# Patient Record
Sex: Female | Born: 1970 | Race: White | Hispanic: No | Marital: Married | Smoking: Former smoker
Health system: Southern US, Community
[De-identification: ages and names within clinical notes are randomized; demographics above are authoritative.]

## PROBLEM LIST (undated history)

## (undated) DIAGNOSIS — E785 Hyperlipidemia, unspecified: Secondary | ICD-10-CM

## (undated) DIAGNOSIS — J45990 Exercise induced bronchospasm: Secondary | ICD-10-CM

## (undated) DIAGNOSIS — F419 Anxiety disorder, unspecified: Secondary | ICD-10-CM

## (undated) DIAGNOSIS — Z9889 Other specified postprocedural states: Secondary | ICD-10-CM

## (undated) DIAGNOSIS — R0609 Other forms of dyspnea: Secondary | ICD-10-CM

## (undated) DIAGNOSIS — N952 Postmenopausal atrophic vaginitis: Secondary | ICD-10-CM

## (undated) DIAGNOSIS — E611 Iron deficiency: Secondary | ICD-10-CM

## (undated) DIAGNOSIS — G43909 Migraine, unspecified, not intractable, without status migrainosus: Secondary | ICD-10-CM

## (undated) DIAGNOSIS — K219 Gastro-esophageal reflux disease without esophagitis: Secondary | ICD-10-CM

## (undated) DIAGNOSIS — E559 Vitamin D deficiency, unspecified: Secondary | ICD-10-CM

## (undated) DIAGNOSIS — J189 Pneumonia, unspecified organism: Secondary | ICD-10-CM

## (undated) DIAGNOSIS — M1990 Unspecified osteoarthritis, unspecified site: Secondary | ICD-10-CM

## (undated) DIAGNOSIS — F32A Depression, unspecified: Secondary | ICD-10-CM

## (undated) DIAGNOSIS — E538 Deficiency of other specified B group vitamins: Secondary | ICD-10-CM

## (undated) HISTORY — DX: Migraine, unspecified, not intractable, without status migrainosus: G43.909

## (undated) HISTORY — DX: Unspecified osteoarthritis, unspecified site: M19.90

## (undated) HISTORY — DX: Depression, unspecified: F32.A

## (undated) HISTORY — DX: Anxiety disorder, unspecified: F41.9

---

## 1988-06-06 DIAGNOSIS — O1490 Unspecified pre-eclampsia, unspecified trimester: Secondary | ICD-10-CM

## 1988-06-06 HISTORY — DX: Unspecified pre-eclampsia, unspecified trimester: O14.90

## 2002-06-06 HISTORY — PX: CHOLECYSTECTOMY: SHX55

## 2004-09-14 ENCOUNTER — Ambulatory Visit: Payer: Self-pay | Admitting: General Surgery

## 2007-03-05 ENCOUNTER — Ambulatory Visit: Payer: Self-pay

## 2008-06-12 ENCOUNTER — Emergency Department: Payer: Self-pay | Admitting: Emergency Medicine
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
## 2009-10-11 IMAGING — CR RIGHT FOOT COMPLETE - 3+ VIEW
1 series · 3 of 3 positions shown · non-contrast
Comparison: none

REASON FOR EXAM: Injury, pain, swelling
COMMENTS:   LMP: 2 weeks

[Series 1: view not recorded · 0.17mm/px · 3 of 3 slices shown]
[im 1/3]
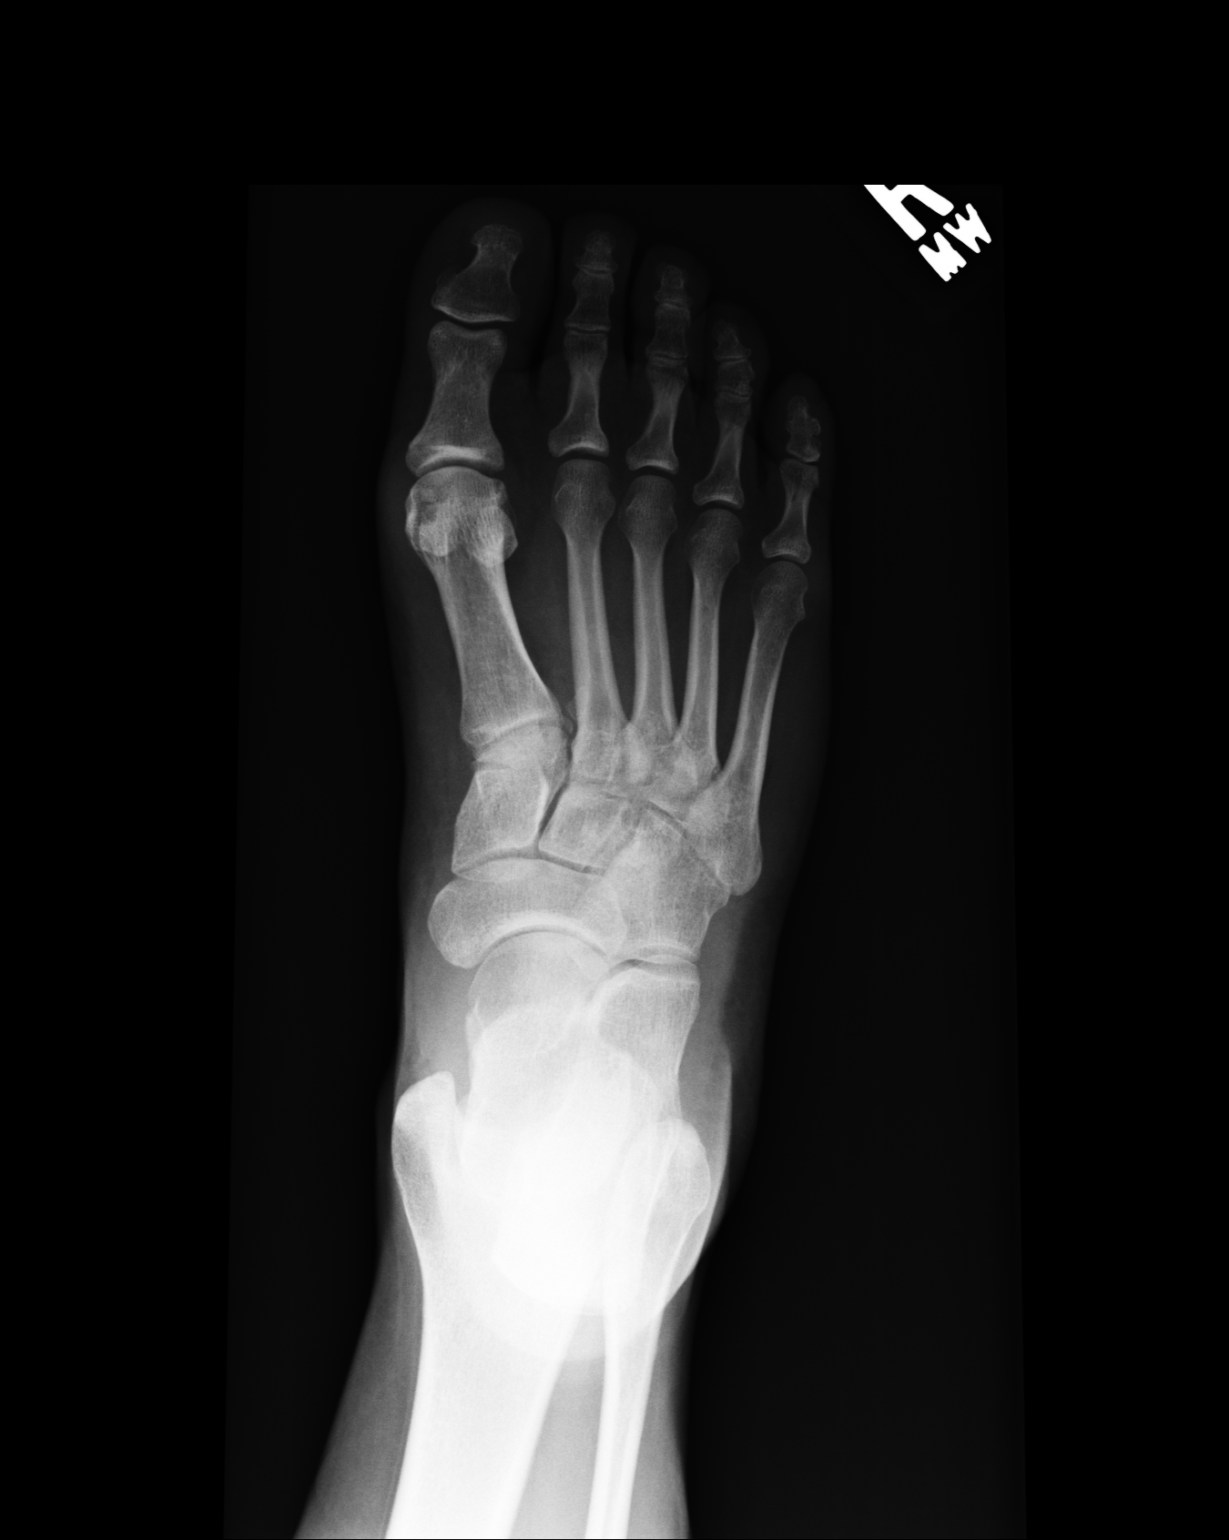
[im 2/3]
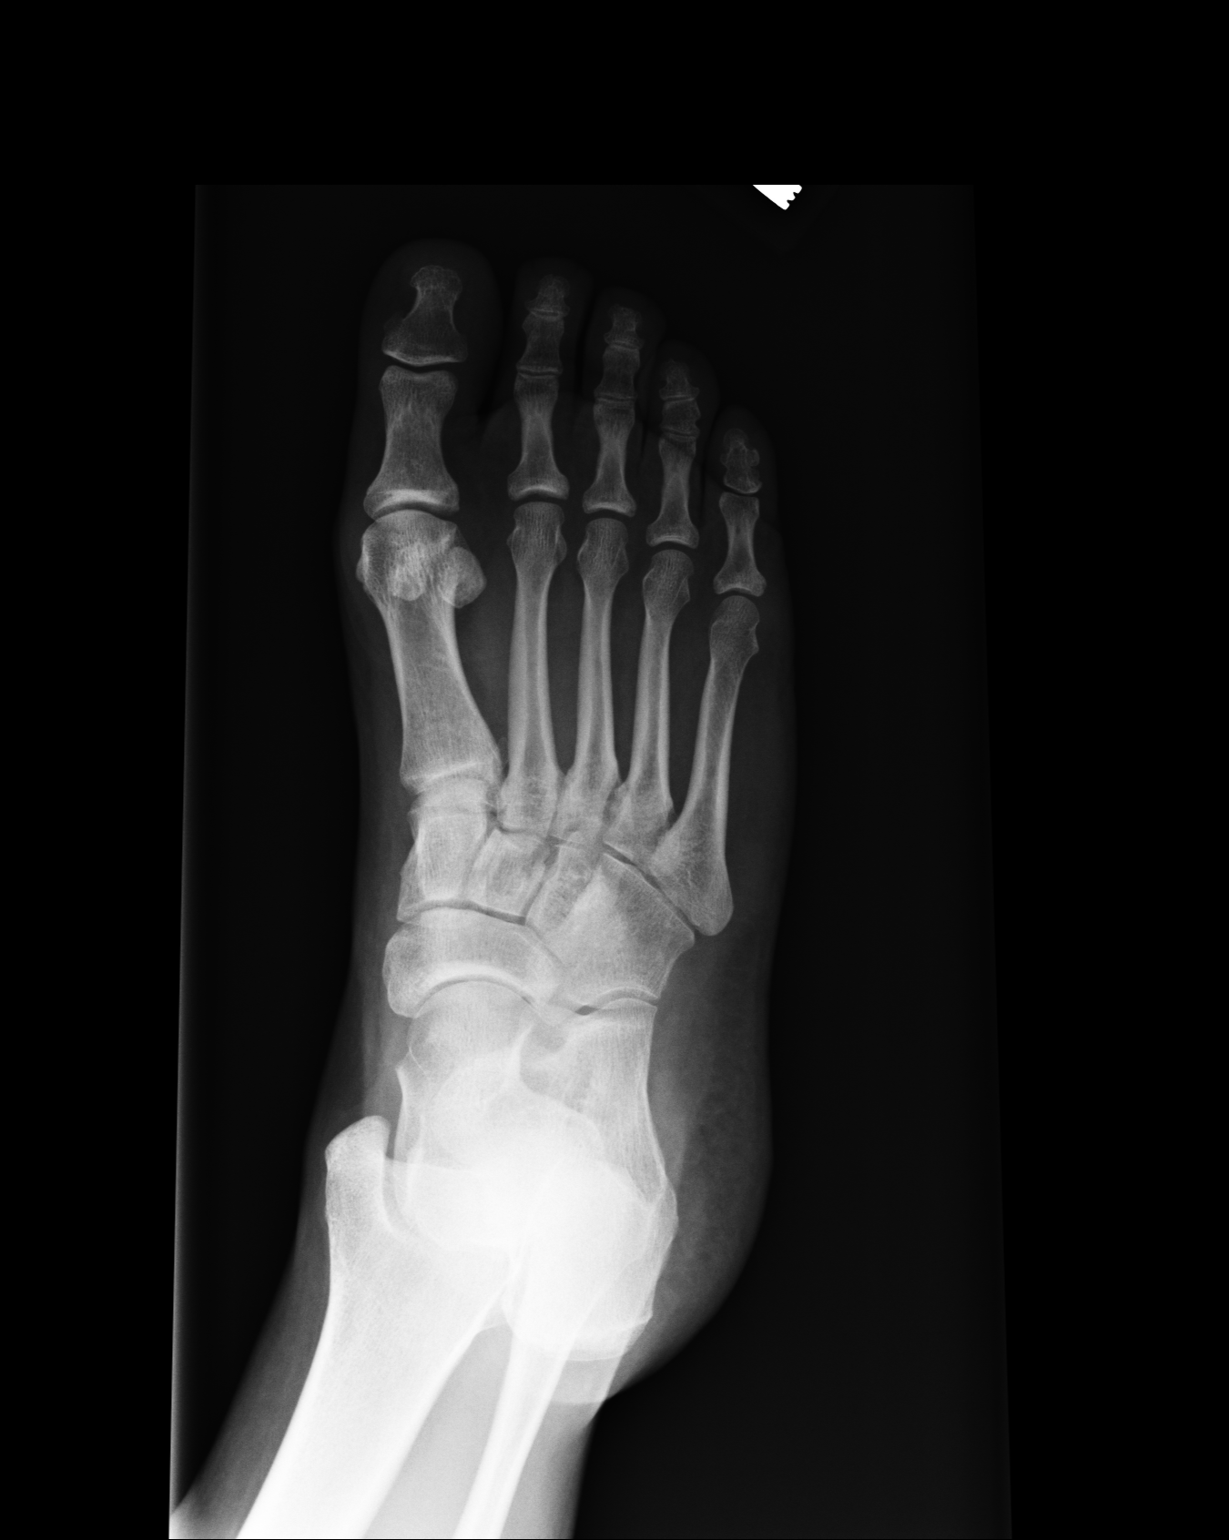
[im 3/3]
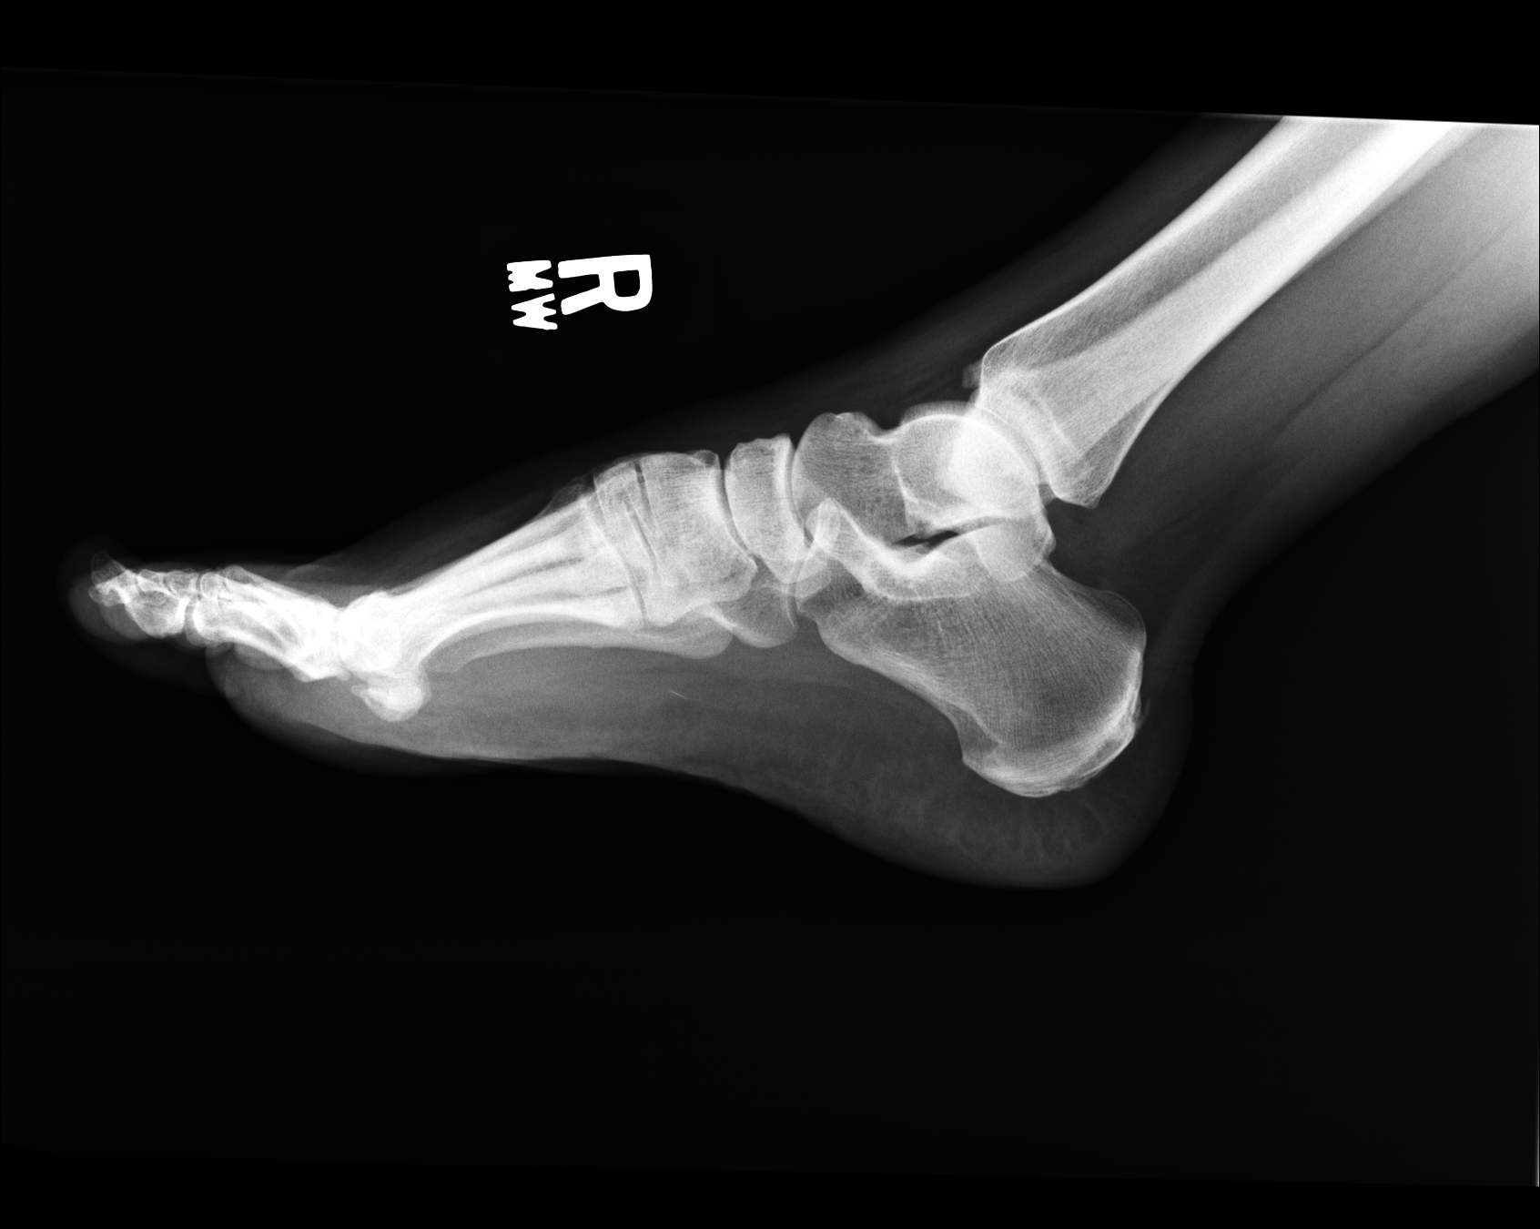

[3 of 3 positions shown; findings below may reference images not displayed]

PROCEDURE:     DXR - DXR FOOT RT COMPLETE W/OBLIQUES  - June 12, 2008  [DATE]

RESULT:     The bones of the foot appear reasonably well mineralized. I do
not see evidence of an acute fracture of the phalanges nor of the
metatarsals. No dislocation is identified. The bones of the hindfoot appear
normal as well. There is a small Achilles region plantar spur. There is a
faint radiodensity projecting inferior to the distal tarsal row on the
lateral film only that may reflect a foreign body. This may be acute or
chronic, however.
IMPRESSION: I do not see evidence of an acute fracture of the bones of
the right foot. Follow-up films coned to any area of persistent symptoms are
recommended. Please see the note above regarding possible linear needle-like
foreign body projecting in the region of the soft tissues of the sole of the
midfoot.

## 2010-03-24 ENCOUNTER — Ambulatory Visit: Payer: Self-pay

## 2010-10-01 IMAGING — CR RIGHT TIBIA AND FIBULA - 2 VIEW
1 series · 2 of 2 positions shown · non-contrast
Comparison: none

REASON FOR EXAM: Injury, pain at proximal tibia, bruising
COMMENTS:   LMP: 2 weeks ago

[Series 1: view not recorded · 0.17mm/px · 2 of 2 slices shown]
[im 1/2]
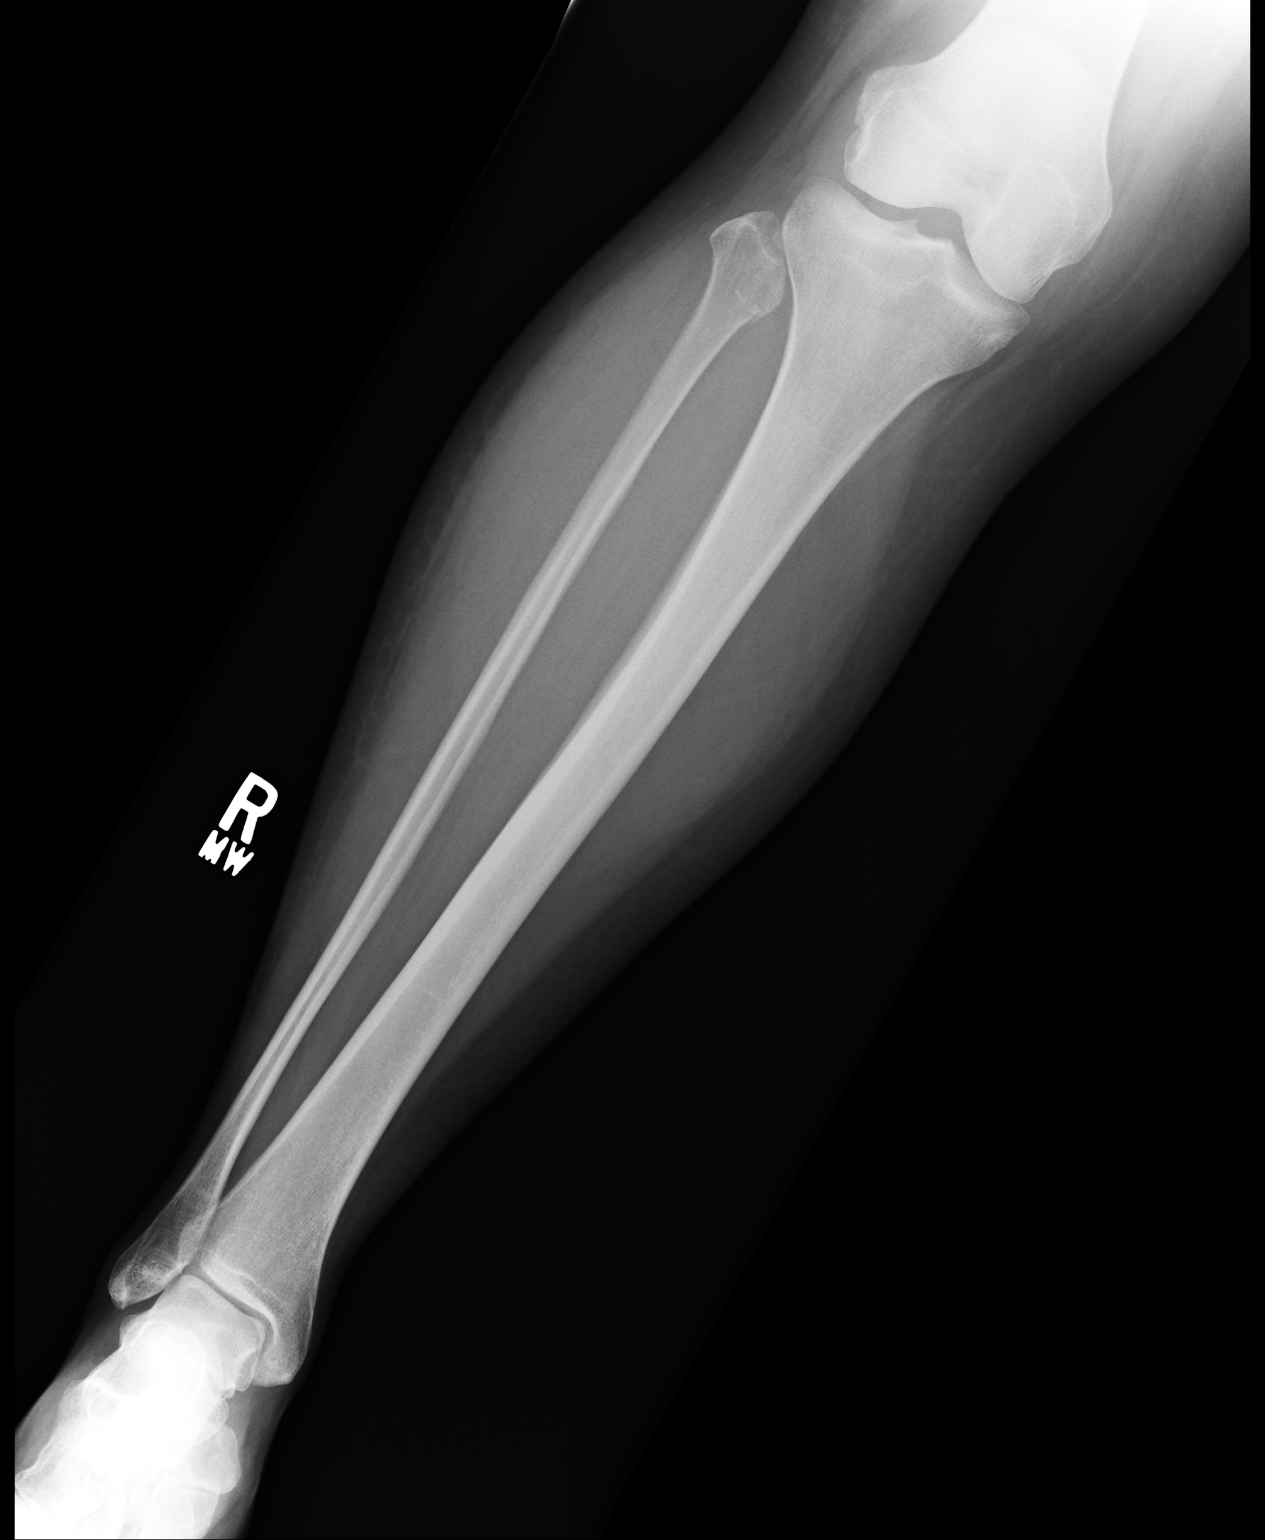
[im 2/2]
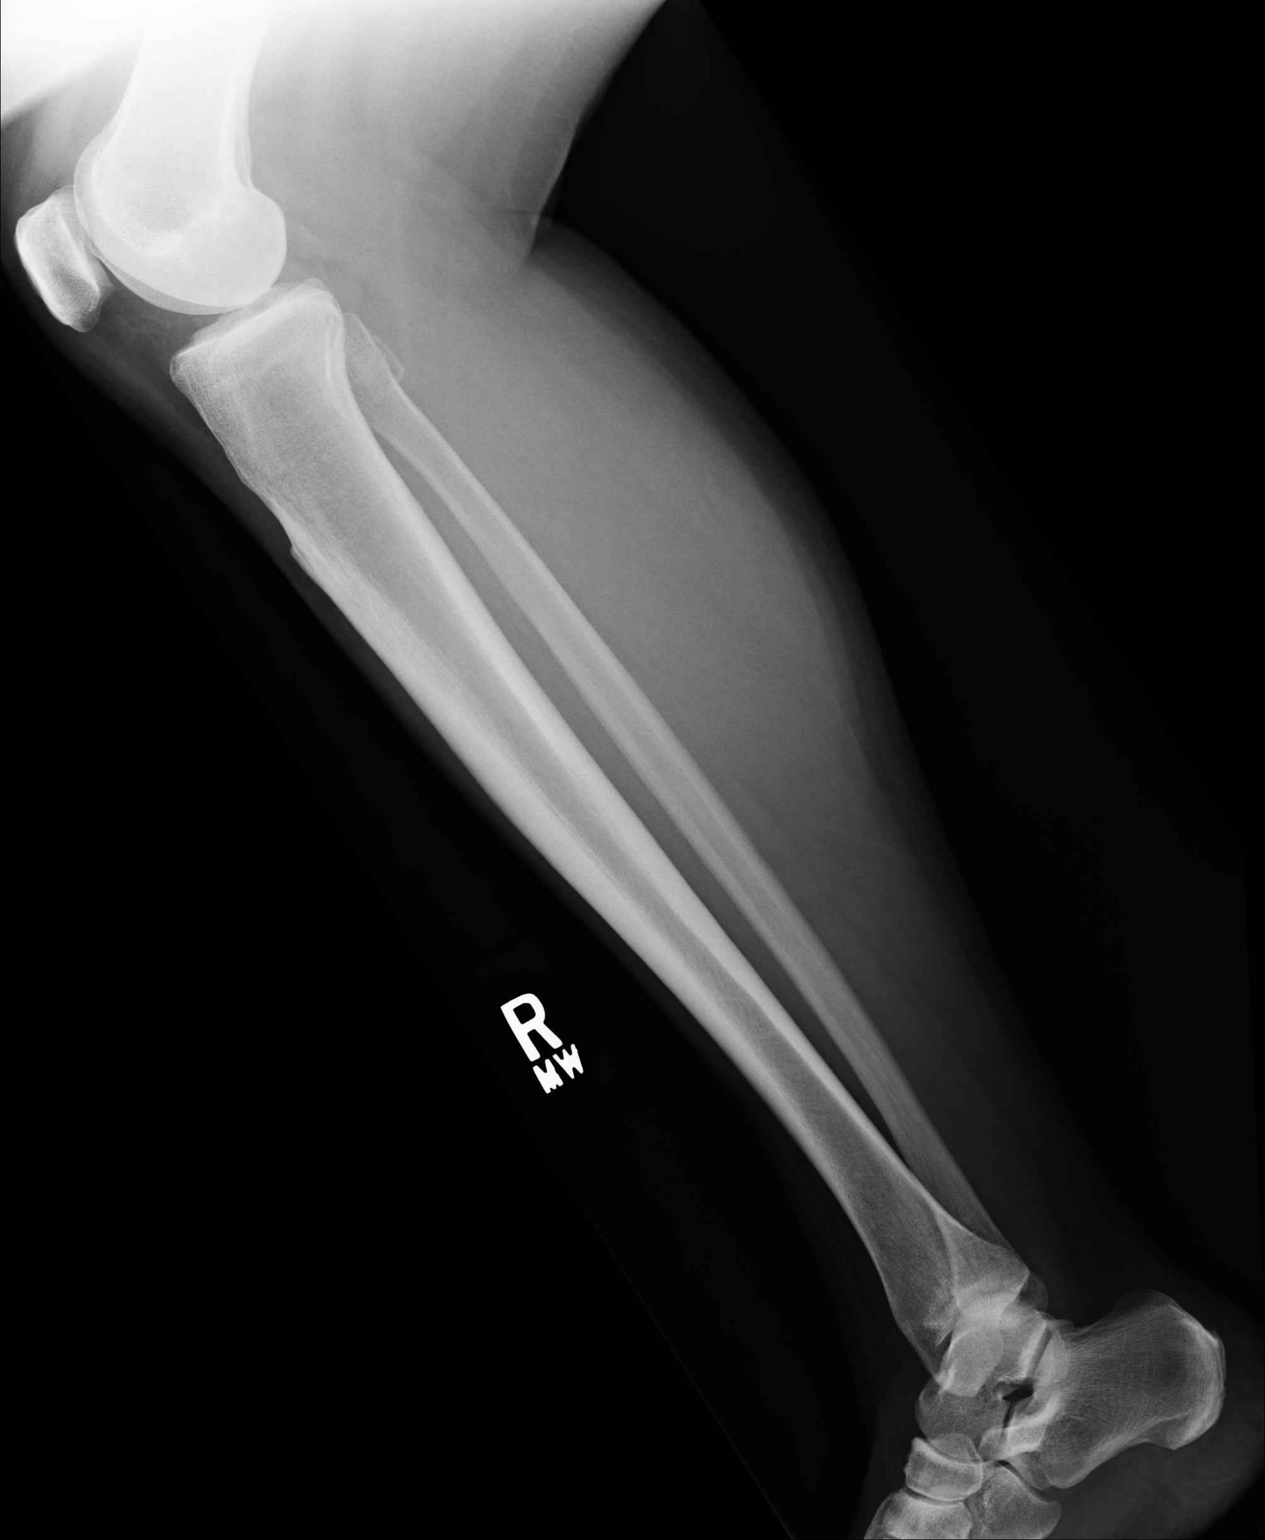

[2 of 2 positions shown; findings below may reference images not displayed]

RESULT:     The patient has sustained a fracture through the proximal
fibula. This fracture is demonstrated best on the lateral film but there is
cortical disruption visible on the frontal film. As best as can be
determined the distal fibula is intact. I see no fracture involving the
diaphysis. The adjacent tibia appears intact.
IMPRESSION: The patient has sustained a nondisplaced fracture of the
proximal fibula. Supplemental imaging of the knee or ankle is available if
the patient has symptoms that warrant this.

## 2012-06-06 DIAGNOSIS — G5601 Carpal tunnel syndrome, right upper limb: Secondary | ICD-10-CM

## 2012-06-06 HISTORY — PX: TRIGGER FINGER RELEASE: SHX641

## 2012-06-06 HISTORY — PX: CARPAL TUNNEL RELEASE: SHX101

## 2012-06-06 HISTORY — DX: Carpal tunnel syndrome, right upper limb: G56.01

## 2014-10-01 DIAGNOSIS — E785 Hyperlipidemia, unspecified: Secondary | ICD-10-CM | POA: Insufficient documentation

## 2014-12-23 DIAGNOSIS — F329 Major depressive disorder, single episode, unspecified: Secondary | ICD-10-CM | POA: Insufficient documentation

## 2014-12-30 DIAGNOSIS — M2022 Hallux rigidus, left foot: Secondary | ICD-10-CM | POA: Insufficient documentation

## 2014-12-30 DIAGNOSIS — M79604 Pain in right leg: Secondary | ICD-10-CM | POA: Insufficient documentation

## 2014-12-30 DIAGNOSIS — M2021 Hallux rigidus, right foot: Secondary | ICD-10-CM | POA: Insufficient documentation

## 2015-07-09 DIAGNOSIS — G5601 Carpal tunnel syndrome, right upper limb: Secondary | ICD-10-CM | POA: Insufficient documentation

## 2015-12-07 DIAGNOSIS — E559 Vitamin D deficiency, unspecified: Secondary | ICD-10-CM | POA: Insufficient documentation

## 2015-12-10 DIAGNOSIS — M65311 Trigger thumb, right thumb: Secondary | ICD-10-CM | POA: Insufficient documentation

## 2018-12-01 DIAGNOSIS — M419 Scoliosis, unspecified: Secondary | ICD-10-CM | POA: Insufficient documentation

## 2018-12-03 DIAGNOSIS — M25511 Pain in right shoulder: Secondary | ICD-10-CM | POA: Insufficient documentation

## 2019-02-13 DIAGNOSIS — R195 Other fecal abnormalities: Secondary | ICD-10-CM | POA: Insufficient documentation

## 2019-04-12 DIAGNOSIS — F419 Anxiety disorder, unspecified: Secondary | ICD-10-CM | POA: Insufficient documentation

## 2020-05-19 DIAGNOSIS — M5430 Sciatica, unspecified side: Secondary | ICD-10-CM | POA: Insufficient documentation

## 2020-05-19 DIAGNOSIS — G8929 Other chronic pain: Secondary | ICD-10-CM | POA: Insufficient documentation

## 2020-06-06 HISTORY — PX: COLONOSCOPY: SHX174

## 2021-05-12 DIAGNOSIS — R0789 Other chest pain: Secondary | ICD-10-CM | POA: Insufficient documentation

## 2021-05-12 DIAGNOSIS — Z6835 Body mass index (BMI) 35.0-35.9, adult: Secondary | ICD-10-CM | POA: Insufficient documentation

## 2021-05-12 DIAGNOSIS — R0609 Other forms of dyspnea: Secondary | ICD-10-CM | POA: Insufficient documentation

## 2021-08-12 DIAGNOSIS — R0789 Other chest pain: Secondary | ICD-10-CM | POA: Diagnosis not present

## 2022-01-03 DIAGNOSIS — H524 Presbyopia: Secondary | ICD-10-CM | POA: Diagnosis not present

## 2022-02-10 ENCOUNTER — Other Ambulatory Visit: Payer: Self-pay | Admitting: Internal Medicine

## 2022-02-10 ENCOUNTER — Ambulatory Visit: Payer: BC Managed Care – PPO | Admitting: Internal Medicine

## 2022-02-10 ENCOUNTER — Encounter: Payer: Self-pay | Admitting: Internal Medicine

## 2022-02-10 VITALS — BP 136/80 | HR 78 | Temp 97.9°F | Resp 16 | Ht 61.5 in | Wt 208.8 lb

## 2022-02-10 DIAGNOSIS — R200 Anesthesia of skin: Secondary | ICD-10-CM | POA: Diagnosis not present

## 2022-02-10 DIAGNOSIS — N952 Postmenopausal atrophic vaginitis: Secondary | ICD-10-CM | POA: Diagnosis not present

## 2022-02-10 DIAGNOSIS — Z6838 Body mass index (BMI) 38.0-38.9, adult: Secondary | ICD-10-CM | POA: Diagnosis not present

## 2022-02-10 DIAGNOSIS — M2560 Stiffness of unspecified joint, not elsewhere classified: Secondary | ICD-10-CM

## 2022-02-10 DIAGNOSIS — G479 Sleep disorder, unspecified: Secondary | ICD-10-CM

## 2022-02-10 DIAGNOSIS — R202 Paresthesia of skin: Secondary | ICD-10-CM

## 2022-02-10 MED ORDER — PREMARIN 0.625 MG/GM VA CREA: TOPICAL_CREAM | VAGINAL | 12 refills | Status: DC

## 2022-02-10 MED ORDER — ESTRADIOL 0.05 MG/24HR TD PTWK: MEDICATED_PATCH | TRANSDERMAL | 2 refills | Status: DC

## 2022-02-10 MED ORDER — TOPIRAMATE 25 MG PO TABS: 25.0000 mg | ORAL_TABLET | Freq: Two times a day (BID) | ORAL | 3 refills | Status: DC

## 2022-02-10 NOTE — Progress Notes (Signed)
[facility] [place], [place] [address]  Internal MEDICINE  Office Visit Note  Patient Name: Natasha Vargas  [id number]  [id number]  Date of Service: 02/10/2022   Complaints/HPI  Chief Complaint  Patient presents with   New Patient (Initial Visit)   Referral    OBGYN  Pt is here for establishment of PCP.  HPI Mom passed away last year, was taking care of her, now wants to get some healthcare Feels weird on left eye and ear, problem with vision, pressure builds up in her head. Eye exam was normal. had flutters, blurry vision at times. H/o migraine headaches. Feels numb at times. Strong [place] of DM Previously has been on Celexa and Buspar but has stopped after her mom passed away She has not had a period in 3 years. Excessive vaginal dryness, hurts to have intercourse.  Has problem maintaining her sleep, tired during the day, snoring as well  Will need mammogram, BMD Will get more information about colonoscopy  C/O arthralgias and stiffness Current Medication: Outpatient Encounter Medications as of 02/10/2022  Medication Sig   conjugated estrogens (PREMARIN) vaginal cream Use 1/3 of the applicator once a week intravaginal   topiramate (TOPAMAX) 25 MG tablet Take 1 tablet (25 mg total) by mouth 2 (two) times daily.   [DISCONTINUED] estradiol (CLIMARA - DOSED IN MG/24 HR) 0.05 mg/24hr patch Use one patch on dry skin 2 a week   No facility-administered encounter medications on file as of 02/10/2022.    Surgical History: Past Surgical History:  Procedure Laterality Date   CHOLECYSTECTOMY  2004    Medical History: Past Medical History:  Diagnosis Date   Anxiety    Arthritis    Depression    Migraines     Family History: Family History  Problem Relation Age of Onset   Hypertension Mother    Heart disease Mother    Diabetes Mother    Glaucoma Mother    Obesity Father    Hypertension Father     Social History   Socioeconomic History    Marital status: Married    Spouse name: Not on file   Number of children: Not on file   Years of education: Not on file   Highest education level: Not on file  Occupational History   Not on file  Tobacco Use   Smoking status: Not on file   Smokeless tobacco: Not on file  Substance and Sexual Activity   Alcohol use: Not on file   Drug use: Never   Sexual activity: Yes  Other Topics Concern   Not on file  Social History Narrative   Not on file   Social Determinants of Health   Financial Resource Strain: Not on file  Food Insecurity: Not on file  Transportation Needs: Not on file  Physical Activity: Not on file  Stress: Not on file  Social Connections: Not on file  Intimate Partner Violence: Not on file     Review of Systems  Constitutional:  Negative for chills, fatigue and unexpected weight change.  HENT:  Negative for congestion, postnasal drip, rhinorrhea, sneezing and sore throat.   Eyes:  Negative for redness.  Respiratory:  Negative for cough, chest tightness and shortness of breath.   Cardiovascular:  Negative for chest pain and palpitations.  Gastrointestinal:  Negative for abdominal pain, constipation, diarrhea, nausea and vomiting.  Genitourinary:  Positive for dyspareunia. Negative for dysuria and frequency.  Musculoskeletal:  Positive for arthralgias and back pain. Negative  for joint swelling and neck pain.  Skin:  Negative for rash.  Neurological: Negative.  Negative for tremors and numbness.  Hematological:  Negative for adenopathy. Does not bruise/bleed easily.  Psychiatric/Behavioral:  Negative for behavioral problems (Depression), sleep disturbance and suicidal ideas. The patient is not nervous/anxious.     Vital Signs: BP 136/80   Pulse 78   Temp 97.9 F (36.6 C)   Resp 16   Ht 5' 1.5" (1.562 m)   Wt 208 lb 12.8 oz (94.7 kg)   SpO2 99%   BMI 38.81 kg/m    Physical Exam Constitutional:      Appearance: Normal appearance.  HENT:     Head:  Normocephalic and atraumatic.     Nose: Nose normal.     Mouth/Throat:     Mouth: Mucous membranes are moist.     Pharynx: No posterior oropharyngeal erythema.  Eyes:     Extraocular Movements: Extraocular movements intact.     Pupils: Pupils are equal, round, and reactive to light.  Cardiovascular:     Pulses: Normal pulses.     Heart sounds: Normal heart sounds.  Pulmonary:     Effort: Pulmonary effort is normal.     Breath sounds: Normal breath sounds.  Neurological:     General: No focal deficit present.     Mental Status: She is alert.  Psychiatric:        Mood and Affect: Mood normal.        Behavior: Behavior normal.       Assessment/Plan: 1. Atrophic vaginitis Patient has severe disease Natasha Vargas will start on low-dose topical hormones estradiol patch along with intravaginal cream for couple of weeks and then will taper down to just the patch if twice a week on the lowest dose. We will also check her fasting lipid profile will need FSH LH and estradiol level as well - conjugated estrogens (PREMARIN) vaginal cream; Use 1/3 of the applicator once a week intravaginal  Dispense: 42.5 g; Refill: 12 - CBC with Differential/Platelet - Lipid Panel With LDL/HDL Ratio - Sed Rate (ESR) - ANA Direct w/Reflex if Positive  2. Numbness and tingling of left side of face Left-sided facial numbness or tingling or pressure feeling patient had an eye exam patient examination which is normal this feeling is there most of the time she feels very uncomfortable and would like to know what is going on patient does give history of migraine headaches in the past, will work on to differential diagnosis. A.  Atypical migraine headache, will treat with low-dose Topamax 25 mg once a day increase to twice a day if improvement seen.   B.  Second possibility is multiple sclerosis patient might need MRI if her symptoms does not resolve  3. Stiffness in joint Complaints of being stiff, lack of exercise  deconditioning however will get sed rate and ANA we will treat accordingly - Sed Rate (ESR) - ANA Direct w/Reflex if Positive  4. Body mass index (BMI) 38.0-38.9, adult Elevated BMI noted we will address on future follow-ups  5. Sleep disturbances Patient has sign and symptoms of OSA ( disturbed sleep, excessive fatigue during the day,  and abnormal BMI). Baseline sleep study is ordered to further look into this. Long term complications of OSA was addressed with the patient.  We will reevaluate   General Counseling: Natasha Vargas verbalizes understanding of the findings of todays visit and agrees with plan of treatment. I have discussed any further diagnostic evaluation that may be needed  or ordered today. We also reviewed her medications today. she has been encouraged to call the office with any questions or concerns that should arise related to todays visit.    Counseling:  Natasha Vargas Controlled Substance Database was reviewed by me.  Orders Placed This Encounter  Procedures   CBC with Differential/Platelet   Lipid Panel With LDL/HDL Ratio   TSH   T4, free   Comprehensive metabolic panel   Sed Rate (ESR)   ANA Direct w/Reflex if Positive   FSH/LH   Estrogens, total    Meds ordered this encounter  Medications   DISCONTD: estradiol (CLIMARA - DOSED IN MG/24 HR) 0.05 mg/24hr patch    Sig: Use one patch on dry skin 2 a week    Dispense:  24 patch    Refill:  2   conjugated estrogens (PREMARIN) vaginal cream    Sig: Use 1/3 of the applicator once a week intravaginal    Dispense:  42.5 g    Refill:  12   topiramate (TOPAMAX) 25 MG tablet    Sig: Take 1 tablet (25 mg total) by mouth 2 (two) times daily.    Dispense:  60 tablet    Refill:  3    Time spent:45 Minutes

## 2022-02-10 NOTE — Telephone Encounter (Signed)
I have put her on both estrogen for few weeks. Please refill

## 2022-02-11 ENCOUNTER — Other Ambulatory Visit: Payer: Self-pay

## 2022-02-11 MED ORDER — ESTRADIOL 0.05 MG/24HR TD PTTW: 1.0000 | MEDICATED_PATCH | TRANSDERMAL | 2 refills | Status: DC

## 2022-02-14 DIAGNOSIS — R202 Paresthesia of skin: Secondary | ICD-10-CM | POA: Diagnosis not present

## 2022-02-14 DIAGNOSIS — R200 Anesthesia of skin: Secondary | ICD-10-CM | POA: Diagnosis not present

## 2022-02-14 DIAGNOSIS — N952 Postmenopausal atrophic vaginitis: Secondary | ICD-10-CM | POA: Diagnosis not present

## 2022-02-14 DIAGNOSIS — N951 Menopausal and female climacteric states: Secondary | ICD-10-CM | POA: Diagnosis not present

## 2022-02-15 LAB — CBC WITH DIFFERENTIAL/PLATELET
Basophils Absolute: 0 10*3/uL (ref 0.0–0.2)
Basos: 1 %
EOS (ABSOLUTE): 0.1 10*3/uL (ref 0.0–0.4)
Eos: 2 %
Hematocrit: 41.6 % (ref 34.0–46.6)
Hemoglobin: 13.9 g/dL (ref 11.1–15.9)
Immature Grans (Abs): 0 10*3/uL (ref 0.0–0.1)
Immature Granulocytes: 0 %
Lymphocytes Absolute: 2.2 10*3/uL (ref 0.7–3.1)
Lymphs: 39 %
MCH: 30 pg (ref 26.6–33.0)
MCHC: 33.4 g/dL (ref 31.5–35.7)
MCV: 90 fL (ref 79–97)
Monocytes Absolute: 0.3 10*3/uL (ref 0.1–0.9)
Monocytes: 6 %
Neutrophils Absolute: 3 10*3/uL (ref 1.4–7.0)
Neutrophils: 52 %
Platelets: 321 10*3/uL (ref 150–450)
RBC: 4.63 x10E6/uL (ref 3.77–5.28)
RDW: 13.1 % (ref 11.7–15.4)
WBC: 5.7 10*3/uL (ref 3.4–10.8)

## 2022-02-15 LAB — LIPID PANEL WITH LDL/HDL RATIO
Cholesterol, Total: 239 mg/dL — ABNORMAL HIGH (ref 100–199)
HDL: 46 mg/dL (ref >39)
LDL Chol Calc (NIH): 174 mg/dL — ABNORMAL HIGH (ref 0–99)
LDL/HDL Ratio: 3.8 ratio — ABNORMAL HIGH (ref 0.0–3.2)
Triglycerides: 105 mg/dL (ref 0–149)
VLDL Cholesterol Cal: 19 mg/dL (ref 5–40)

## 2022-02-15 LAB — SEDIMENTATION RATE: Sed Rate: 24 mm/hr (ref 0–40)

## 2022-02-15 LAB — COMPREHENSIVE METABOLIC PANEL
ALT: 18 IU/L (ref 0–32)
AST: 17 IU/L (ref 0–40)
Albumin/Globulin Ratio: 2 (ref 1.2–2.2)
Albumin: 4.5 g/dL (ref 3.8–4.9)
Alkaline Phosphatase: 67 IU/L (ref 44–121)
BUN/Creatinine Ratio: 17 (ref 9–23)
BUN: 14 mg/dL (ref 6–24)
Bilirubin Total: 0.4 mg/dL (ref 0.0–1.2)
CO2: 21 mmol/L (ref 20–29)
Calcium: 9.5 mg/dL (ref 8.7–10.2)
Chloride: 105 mmol/L (ref 96–106)
Creatinine, Ser: 0.81 mg/dL (ref 0.57–1.00)
Globulin, Total: 2.3 g/dL (ref 1.5–4.5)
Glucose: 99 mg/dL (ref 70–99)
Potassium: 4.6 mmol/L (ref 3.5–5.2)
Sodium: 141 mmol/L (ref 134–144)
Total Protein: 6.8 g/dL (ref 6.0–8.5)
eGFR: 87 mL/min/{1.73_m2} (ref >59)

## 2022-02-15 LAB — TSH: TSH: 1.37 u[IU]/mL (ref 0.450–4.500)

## 2022-02-15 LAB — T4, FREE: Free T4: 1.91 ng/dL — ABNORMAL HIGH (ref 0.82–1.77)

## 2022-02-15 LAB — ANA W/REFLEX IF POSITIVE: Anti Nuclear Antibody (ANA): NEGATIVE

## 2022-02-18 LAB — ESTROGENS, TOTAL: Estrogen: 119 pg/mL

## 2022-02-18 LAB — FSH/LH
FSH: 141 m[IU]/mL
LH: 51 m[IU]/mL

## 2022-02-24 NOTE — Progress Notes (Signed)
Labs will be discussed on next follow up

## 2022-03-01 ENCOUNTER — Ambulatory Visit: Payer: BC Managed Care – PPO | Admitting: Internal Medicine

## 2022-03-01 ENCOUNTER — Encounter: Payer: Self-pay | Admitting: Internal Medicine

## 2022-03-01 VITALS — BP 139/86 | HR 88 | Temp 98.1°F | Resp 16 | Ht 61.5 in | Wt 208.2 lb

## 2022-03-01 DIAGNOSIS — E782 Mixed hyperlipidemia: Secondary | ICD-10-CM | POA: Diagnosis not present

## 2022-03-01 DIAGNOSIS — Z1231 Encounter for screening mammogram for malignant neoplasm of breast: Secondary | ICD-10-CM

## 2022-03-01 DIAGNOSIS — R7989 Other specified abnormal findings of blood chemistry: Secondary | ICD-10-CM

## 2022-03-01 DIAGNOSIS — R202 Paresthesia of skin: Secondary | ICD-10-CM

## 2022-03-01 DIAGNOSIS — R200 Anesthesia of skin: Secondary | ICD-10-CM | POA: Diagnosis not present

## 2022-03-01 DIAGNOSIS — N952 Postmenopausal atrophic vaginitis: Secondary | ICD-10-CM

## 2022-03-01 NOTE — Progress Notes (Signed)
[facility] [address] [place], [place] [address]  Internal MEDICINE  Office Visit Note  Patient Name: Natasha Vargas  [id number]  [id number]  Date of Service: 03/02/2022  Chief Complaint  Patient presents with   Follow-up   Depression   Quality Metric Gaps    Shingles Vaccine    HPI  Pt was started on Topamax for possible atypical migraine headaches. Improving, Felt  weird on left eye and ear, problem with vision, pressure builds up in her head. Eye exam was normal. had flutters, blurry vision at times. H/o migraine headaches. Feels numb at times. Strong FMH of DM Previously has been on Celexa and Buspar but has stopped after her mom passed away She has not had a period in 3 years. Excessive vaginal dryness, hurts to have intercourse. Pt was started on HRT , mild improvement seen  Has problem maintaining her sleep, tired during the day, snoring as well  C/O arthralgias  Labs showed abnormal lipid profile and elevated Free T4   Current Medication: Outpatient Encounter Medications as of 03/01/2022  Medication Sig   conjugated estrogens (PREMARIN) vaginal cream Use 1/3 of the applicator once a week intravaginal   estradiol (VIVELLE-DOT) 0.05 MG/24HR patch Place 1 patch (0.05 mg total) onto the skin 2 (two) times a week.   topiramate (TOPAMAX) 25 MG tablet Take 1 tablet (25 mg total) by mouth 2 (two) times daily.   No facility-administered encounter medications on file as of 03/01/2022.    Surgical History: Past Surgical History:  Procedure Laterality Date   CHOLECYSTECTOMY  2004    Medical History: Past Medical History:  Diagnosis Date   Anxiety    Arthritis    Depression    Migraines     Family History: Family History  Problem Relation Age of Onset   Hypertension Mother    Heart disease Mother    Diabetes Mother    Glaucoma Mother    Obesity Father    Hypertension Father     Social History   Socioeconomic History   Marital status:  Married    Spouse name: Not on file   Number of children: Not on file   Years of education: Not on file   Highest education level: Not on file  Occupational History   Not on file  Tobacco Use   Smoking status: Never    Passive exposure: Never   Smokeless tobacco: Never  Substance and Sexual Activity   Alcohol use: Not on file   Drug use: Never   Sexual activity: Yes  Other Topics Concern   Not on file  Social History Narrative   Not on file   Social Determinants of Health   Financial Resource Strain: Not on file  Food Insecurity: Not on file  Transportation Needs: Not on file  Physical Activity: Not on file  Stress: Not on file  Social Connections: Not on file  Intimate Partner Violence: Not on file      Review of Systems  Constitutional:  Negative for chills, fatigue and unexpected weight change.  HENT:  Negative for congestion, postnasal drip, rhinorrhea, sneezing and sore throat.   Eyes:  Negative for redness.  Respiratory:  Negative for cough, chest tightness and shortness of breath.   Cardiovascular:  Negative for chest pain and palpitations.  Gastrointestinal:  Negative for abdominal pain, constipation, diarrhea, nausea and vomiting.  Genitourinary:  Negative for dysuria and frequency.  Musculoskeletal:  Negative for arthralgias, back pain, joint swelling and neck  pain.  Skin:  Negative for rash.  Neurological: Negative.  Negative for tremors and numbness.  Hematological:  Negative for adenopathy. Does not bruise/bleed easily.  Psychiatric/Behavioral:  Negative for behavioral problems (Depression), sleep disturbance and suicidal ideas. The patient is not nervous/anxious.     Vital Signs: BP 139/86   Pulse 88   Temp 98.1 F (36.7 C)   Resp 16   Ht 5' 1.5" (1.562 m)   Wt 208 lb 3.2 oz (94.4 kg)   SpO2 100%   BMI 38.70 kg/m    Physical Exam Constitutional:      Appearance: Normal appearance.  HENT:     Head: Normocephalic and atraumatic.     Nose:  Nose normal.     Mouth/Throat:     Mouth: Mucous membranes are moist.     Pharynx: No posterior oropharyngeal erythema.  Eyes:     Extraocular Movements: Extraocular movements intact.     Pupils: Pupils are equal, round, and reactive to light.  Cardiovascular:     Pulses: Normal pulses.     Heart sounds: Normal heart sounds.  Pulmonary:     Effort: Pulmonary effort is normal.     Breath sounds: Normal breath sounds.  Neurological:     General: No focal deficit present.     Mental Status: She is alert.  Psychiatric:        Mood and Affect: Mood normal.        Behavior: Behavior normal.        Assessment/Plan: 1. Elevated serum free T4 level Elevated free T4 we will get ultrasound of thyroid - US THYROID; Future  2. Numbness and tingling of left side of face Atypical migraine headache improved on Topamax we will increase Topamax to 25 mg 2 tablets at night  3. Atrophic vaginitis Continue on HRT as before  4. Visit for screening mammogram - MM DIGITAL SCREENING BILATERAL; Future  5. Mixed hyperlipidemia Patient instructed to watch her diet, low-fat low-cholesterol information is given about DASH diet  General Counseling: Natasha Vargas verbalizes understanding of the findings of todays visit and agrees with plan of treatment. I have discussed any further diagnostic evaluation that may be needed or ordered today. We also reviewed her medications today. she has been encouraged to call the office with any questions or concerns that should arise related to todays visit.    Orders Placed This Encounter  Procedures   US THYROID   MM 3D SCREEN BREAST BILATERAL    No orders of the defined types were placed in this encounter.   Total time spent:35 Minutes Time spent includes review of chart, medications, test results, and follow up plan with the patient.   [place] Controlled Substance Database was reviewed by me.   Dr Lavera Guise Internal medicine

## 2022-03-05 ENCOUNTER — Encounter: Payer: Self-pay | Admitting: Internal Medicine

## 2022-03-07 ENCOUNTER — Other Ambulatory Visit: Payer: Self-pay | Admitting: Internal Medicine

## 2022-03-07 DIAGNOSIS — R200 Anesthesia of skin: Secondary | ICD-10-CM

## 2022-03-07 DIAGNOSIS — R7989 Other specified abnormal findings of blood chemistry: Secondary | ICD-10-CM

## 2022-03-07 DIAGNOSIS — G479 Sleep disorder, unspecified: Secondary | ICD-10-CM

## 2022-03-14 ENCOUNTER — Ambulatory Visit: Payer: BC Managed Care – PPO

## 2022-03-14 DIAGNOSIS — G479 Sleep disorder, unspecified: Secondary | ICD-10-CM | POA: Diagnosis not present

## 2022-03-14 DIAGNOSIS — R7989 Other specified abnormal findings of blood chemistry: Secondary | ICD-10-CM

## 2022-03-14 DIAGNOSIS — R202 Paresthesia of skin: Secondary | ICD-10-CM | POA: Diagnosis not present

## 2022-03-14 DIAGNOSIS — R200 Anesthesia of skin: Secondary | ICD-10-CM | POA: Diagnosis not present

## 2022-03-15 ENCOUNTER — Telehealth: Payer: Self-pay

## 2022-03-15 LAB — VITAMIN D 25 HYDROXY (VIT D DEFICIENCY, FRACTURES): Vit D, 25-Hydroxy: 30.6 ng/mL (ref 30.0–100.0)

## 2022-03-15 LAB — IRON,TIBC AND FERRITIN PANEL
Ferritin: 100 ng/mL (ref 15–150)
Iron Saturation: 22 % (ref 15–55)
Iron: 63 ug/dL (ref 27–159)
Total Iron Binding Capacity: 292 ug/dL (ref 250–450)
UIBC: 229 ug/dL (ref 131–425)

## 2022-03-15 LAB — B12 AND FOLATE PANEL
Folate: 13.9 ng/mL (ref >3.0)
Vitamin B-12: 202 pg/mL — ABNORMAL LOW (ref 232–1245)

## 2022-03-15 NOTE — Telephone Encounter (Signed)
-----   Message from Lavera Guise, MD sent at 03/15/2022  9:08 AM EDT ----- Pt needs to get B12 1000 mcg once a week x 3 weeks and then once a month, will discuss on next visit

## 2022-03-15 NOTE — Telephone Encounter (Signed)
Pt advised that B12 is low need b12 injection once a week for 3 weeks and then once a month

## 2022-03-15 NOTE — Progress Notes (Signed)
Pt needs to get B12 1000 mcg once a week x 3 weeks and then once a month, will discuss on next visit

## 2022-03-17 ENCOUNTER — Ambulatory Visit (INDEPENDENT_AMBULATORY_CARE_PROVIDER_SITE_OTHER): Payer: BC Managed Care – PPO

## 2022-03-17 DIAGNOSIS — E538 Deficiency of other specified B group vitamins: Secondary | ICD-10-CM | POA: Diagnosis not present

## 2022-03-17 MED ORDER — CYANOCOBALAMIN 1000 MCG/ML IJ SOLN
1000.0000 ug | Freq: Once | INTRAMUSCULAR | Status: AC
  Administered 2022-03-17: 1000 ug via INTRAMUSCULAR

## 2022-03-18 ENCOUNTER — Ambulatory Visit: Payer: BC Managed Care – PPO

## 2022-03-24 ENCOUNTER — Ambulatory Visit (INDEPENDENT_AMBULATORY_CARE_PROVIDER_SITE_OTHER): Payer: BC Managed Care – PPO

## 2022-03-24 DIAGNOSIS — E538 Deficiency of other specified B group vitamins: Secondary | ICD-10-CM

## 2022-03-24 MED ORDER — CYANOCOBALAMIN 1000 MCG/ML IJ SOLN
1000.0000 ug | Freq: Once | INTRAMUSCULAR | Status: AC
  Administered 2022-03-24: 1000 ug via INTRAMUSCULAR

## 2022-03-25 ENCOUNTER — Ambulatory Visit: Payer: BC Managed Care – PPO

## 2022-03-30 ENCOUNTER — Ambulatory Visit (INDEPENDENT_AMBULATORY_CARE_PROVIDER_SITE_OTHER): Payer: BC Managed Care – PPO

## 2022-03-30 DIAGNOSIS — E538 Deficiency of other specified B group vitamins: Secondary | ICD-10-CM

## 2022-03-30 MED ORDER — CYANOCOBALAMIN 1000 MCG/ML IJ SOLN
1000.0000 ug | Freq: Once | INTRAMUSCULAR | Status: AC
  Administered 2022-03-30: 1000 ug via INTRAMUSCULAR

## 2022-03-31 ENCOUNTER — Ambulatory Visit: Payer: BC Managed Care – PPO

## 2022-03-31 ENCOUNTER — Encounter: Payer: Self-pay | Admitting: Internal Medicine

## 2022-04-19 ENCOUNTER — Other Ambulatory Visit: Payer: BC Managed Care – PPO | Admitting: Internal Medicine

## 2022-04-27 ENCOUNTER — Ambulatory Visit: Payer: BC Managed Care – PPO

## 2022-05-03 ENCOUNTER — Ambulatory Visit (INDEPENDENT_AMBULATORY_CARE_PROVIDER_SITE_OTHER): Payer: BC Managed Care – PPO | Admitting: Internal Medicine

## 2022-05-03 ENCOUNTER — Encounter: Payer: Self-pay | Admitting: Internal Medicine

## 2022-05-03 ENCOUNTER — Telehealth: Payer: Self-pay | Admitting: Internal Medicine

## 2022-05-03 VITALS — BP 122/67 | HR 81 | Temp 98.2°F | Resp 16 | Ht 61.5 in | Wt 206.2 lb

## 2022-05-03 DIAGNOSIS — G43109 Migraine with aura, not intractable, without status migrainosus: Secondary | ICD-10-CM

## 2022-05-03 DIAGNOSIS — R300 Dysuria: Secondary | ICD-10-CM | POA: Diagnosis not present

## 2022-05-03 DIAGNOSIS — Z0001 Encounter for general adult medical examination with abnormal findings: Secondary | ICD-10-CM

## 2022-05-03 DIAGNOSIS — E538 Deficiency of other specified B group vitamins: Secondary | ICD-10-CM | POA: Diagnosis not present

## 2022-05-03 DIAGNOSIS — Z6838 Body mass index (BMI) 38.0-38.9, adult: Secondary | ICD-10-CM

## 2022-05-03 DIAGNOSIS — E782 Mixed hyperlipidemia: Secondary | ICD-10-CM | POA: Diagnosis not present

## 2022-05-03 DIAGNOSIS — E2839 Other primary ovarian failure: Secondary | ICD-10-CM

## 2022-05-03 DIAGNOSIS — R7989 Other specified abnormal findings of blood chemistry: Secondary | ICD-10-CM

## 2022-05-03 DIAGNOSIS — J4520 Mild intermittent asthma, uncomplicated: Secondary | ICD-10-CM

## 2022-05-03 MED ORDER — COMBIVENT RESPIMAT 20-100 MCG/ACT IN AERS: INHALATION_SPRAY | RESPIRATORY_TRACT | 3 refills | Status: DC

## 2022-05-03 MED ORDER — TOPIRAMATE 25 MG PO TABS: ORAL_TABLET | ORAL | 1 refills | Status: DC

## 2022-05-03 MED ORDER — CYANOCOBALAMIN 1000 MCG/ML IJ SOLN
1000.0000 ug | Freq: Once | INTRAMUSCULAR | Status: AC
  Administered 2022-05-03: 1000 ug via INTRAMUSCULAR

## 2022-05-03 NOTE — Telephone Encounter (Signed)
Faxed MR requests to: Dr. Georgian Co; [phone] [facility]; [phone] [facility]; [phone] [facility]; [phone]

## 2022-05-03 NOTE — Progress Notes (Signed)
[facility] [place], [place] [address]  Internal MEDICINE  Office Visit Note  Patient Name: Natasha Vargas  [id number]  [id number]  Date of Service: 05/03/2022  Chief Complaint  Patient presents with  . Annual Exam    Review U/S  . Medication Problem    Premarin cream is not working, patient does not want to do Pap due to this. Patient would like to speak with provider in order to decide if a Pap should be completed today or not.  . Quality Metric Gaps    Shingles Vaccine     HPI Pt is here for routine health maintenance examination Continues to complain about vaginal dryness and painful intercourse. She has been using OTC lubricant, recently was started on HRT, she is on antiplatelet therapy briefly including will be well patch and Premarin vaginal cream will be taper off in few months Patient will schedule bone density as well Continues to be on B12 injections Her left sided numbness on her face is still improved on Topamax so she still has some breakthrough numbness and pain Has history of reactive airway disease has been sick for last few weeks feels better however has been coughing like to have a refill on her Combivent inhaler She has still elevated free T4 and ultrasound of the thyroid was also ordered  Current Medication: Outpatient Encounter Medications as of 05/03/2022  Medication Sig  . conjugated estrogens (PREMARIN) vaginal cream Use 1/3 of the applicator once a week intravaginal  . estradiol (VIVELLE-DOT) 0.05 MG/24HR patch Place 1 patch (0.05 mg total) onto the skin 2 (two) times a week.  . topiramate (TOPAMAX) 25 MG tablet Take 1 tablet (25 mg total) by mouth 2 (two) times daily.  . [EXPIRED] cyanocobalamin (VITAMIN B12) injection 1,000 mcg    No facility-administered encounter medications on file as of 05/03/2022.    Surgical History: Past Surgical History:  Procedure Laterality Date  . CHOLECYSTECTOMY  2004    Medical  History: Past Medical History:  Diagnosis Date  . Anxiety   . Arthritis   . Depression   . Migraines     Family History: Family History  Problem Relation Age of Onset  . Hypertension Mother   . Heart disease Mother   . Diabetes Mother   . Glaucoma Mother   . Obesity Father   . Hypertension Father     Social History: Social History   Socioeconomic History  . Marital status: Married    Spouse name: Not on file  . Number of children: Not on file  . Years of education: Not on file  . Highest education level: Not on file  Occupational History  . Not on file  Tobacco Use  . Smoking status: Never    Passive exposure: Never  . Smokeless tobacco: Never  Substance and Sexual Activity  . Alcohol use: Not on file  . Drug use: Never  . Sexual activity: Yes  Other Topics Concern  . Not on file  Social History Narrative  . Not on file   Social Determinants of Health   Financial Resource Strain: Not on file  Food Insecurity: Not on file  Transportation Needs: Not on file  Physical Activity: Not on file  Stress: Not on file  Social Connections: Not on file      Review of Systems  Constitutional:  Negative for chills, fatigue, fever and unexpected weight change.  HENT:  Negative for congestion, mouth sores, postnasal drip, rhinorrhea, sneezing and  sore throat.   Eyes:  Negative for redness.  Respiratory:  Negative for cough, chest tightness and shortness of breath.   Cardiovascular:  Negative for chest pain and palpitations.  Gastrointestinal:  Negative for abdominal pain, constipation, diarrhea, nausea and vomiting.  Genitourinary:  Positive for dyspareunia and vaginal pain. Negative for dysuria, flank pain and frequency.  Musculoskeletal:  Negative for arthralgias, back pain, joint swelling and neck pain.  Skin:  Negative for rash.  Neurological: Negative.  Negative for tremors and numbness.  Hematological:  Negative for adenopathy. Does not bruise/bleed easily.   Psychiatric/Behavioral: Negative.  Negative for behavioral problems (Depression), sleep disturbance and suicidal ideas. The patient is not nervous/anxious.      Vital Signs: BP 122/67   Pulse 81   Temp 98.2 F (36.8 C)   Resp 16   Ht 5' 1.5" (1.562 m)   Wt 206 lb 3.2 oz (93.5 kg)   SpO2 99%   BMI 38.33 kg/m    Physical Exam Constitutional:      General: She is not in acute distress.    Appearance: Normal appearance. She is well-developed. She is not diaphoretic.  HENT:     Head: Normocephalic and atraumatic.     Right Ear: External ear normal.     Left Ear: External ear normal.     Nose: Nose normal.     Mouth/Throat:     Mouth: Mucous membranes are moist.     Pharynx: No oropharyngeal exudate or posterior oropharyngeal erythema.  Eyes:     General: No scleral icterus.       Right eye: No discharge.        Left eye: No discharge.     Extraocular Movements: Extraocular movements intact.     Conjunctiva/sclera: Conjunctivae normal.     Pupils: Pupils are equal, round, and reactive to light.  Neck:     Thyroid: No thyromegaly.     Vascular: No JVD.     Trachea: No tracheal deviation.  Cardiovascular:     Rate and Rhythm: Normal rate and regular rhythm.     Pulses: Normal pulses.     Heart sounds: Normal heart sounds. No murmur heard.    No friction rub. No gallop.  Pulmonary:     Effort: Pulmonary effort is normal. No respiratory distress.     Breath sounds: Normal breath sounds. No stridor. No wheezing or rales.  Chest:     Chest wall: No tenderness.  Breasts:    Right: Normal.     Left: Normal.  Abdominal:     General: Bowel sounds are normal. There is no distension.     Palpations: Abdomen is soft. There is no mass.     Tenderness: There is no abdominal tenderness. There is no guarding or rebound.  Genitourinary:    General: Normal vulva.     Comments: Pap is not performed due to excessive dryness  Musculoskeletal:        General: No tenderness or  deformity. Normal range of motion.     Cervical back: Normal range of motion and neck supple.  Lymphadenopathy:     Cervical: No cervical adenopathy.     Upper Body:     Right upper body: No axillary adenopathy.     Left upper body: No axillary adenopathy.  Skin:    General: Skin is warm and dry.     Coloration: Skin is not pale.     Findings: No erythema or rash.  Neurological:  General: No focal deficit present.     Mental Status: She is alert.     Cranial Nerves: No cranial nerve deficit.     Motor: No abnormal muscle tone.     Coordination: Coordination normal.     Deep Tendon Reflexes: Reflexes are normal and symmetric.  Psychiatric:        Mood and Affect: Mood normal.        Behavior: Behavior normal.        Thought Content: Thought content normal.        Judgment: Judgment normal.     LABS: Recent Results (from the past 2160 hour(s))  CBC with Differential/Platelet     Status: None   Collection Time: 02/14/22  8:26 AM  Result Value Ref Range   WBC 5.7 3.4 - 10.8 x10E3/uL   RBC 4.63 3.77 - 5.28 x10E6/uL   Hemoglobin 13.9 11.1 - 15.9 g/dL   Hematocrit 41.6 34.0 - 46.6 %   MCV 90 79 - 97 fL   MCH 30.0 26.6 - 33.0 pg   MCHC 33.4 31.5 - 35.7 g/dL   RDW 13.1 11.7 - 15.4 %   Platelets 321 150 - 450 x10E3/uL   Neutrophils 52 Not Estab. %   Lymphs 39 Not Estab. %   Monocytes 6 Not Estab. %   Eos 2 Not Estab. %   Basos 1 Not Estab. %   Neutrophils Absolute 3.0 1.4 - 7.0 x10E3/uL   Lymphocytes Absolute 2.2 0.7 - 3.1 x10E3/uL   Monocytes Absolute 0.3 0.1 - 0.9 x10E3/uL   EOS (ABSOLUTE) 0.1 0.0 - 0.4 x10E3/uL   Basophils Absolute 0.0 0.0 - 0.2 x10E3/uL   Immature Granulocytes 0 Not Estab. %   Immature Grans (Abs) 0.0 0.0 - 0.1 x10E3/uL  Lipid Panel With LDL/HDL Ratio     Status: Abnormal   Collection Time: 02/14/22  8:26 AM  Result Value Ref Range   Cholesterol, Total 239 (H) 100 - 199 mg/dL   Triglycerides 105 0 - 149 mg/dL   HDL 46 >39 mg/dL   VLDL  Cholesterol Cal 19 5 - 40 mg/dL   LDL Chol Calc (NIH) 174 (H) 0 - 99 mg/dL   LDL/HDL Ratio 3.8 (H) 0.0 - 3.2 ratio    Comment:                                     LDL/HDL Ratio                                             Men  Women                               1/2 Avg.Risk  1.0    1.5                                   Avg.Risk  3.6    3.2                                2X Avg.Risk  6.2    5.0  3X Avg.Risk  8.0    6.1   TSH     Status: None   Collection Time: 02/14/22  8:26 AM  Result Value Ref Range   TSH 1.370 0.450 - 4.500 uIU/mL  T4, free     Status: Abnormal   Collection Time: 02/14/22  8:26 AM  Result Value Ref Range   Free T4 1.91 (H) 0.82 - 1.77 ng/dL  Comprehensive metabolic panel     Status: None   Collection Time: 02/14/22  8:26 AM  Result Value Ref Range   Glucose 99 70 - 99 mg/dL   BUN 14 6 - 24 mg/dL   Creatinine, Ser 0.81 0.57 - 1.00 mg/dL   eGFR 87 >59 mL/min/1.73   BUN/Creatinine Ratio 17 9 - 23   Sodium 141 134 - 144 mmol/L   Potassium 4.6 3.5 - 5.2 mmol/L   Chloride 105 96 - 106 mmol/L   CO2 21 20 - 29 mmol/L   Calcium 9.5 8.7 - 10.2 mg/dL   Total Protein 6.8 6.0 - 8.5 g/dL   Albumin 4.5 3.8 - 4.9 g/dL   Globulin, Total 2.3 1.5 - 4.5 g/dL   Albumin/Globulin Ratio 2.0 1.2 - 2.2   Bilirubin Total 0.4 0.0 - 1.2 mg/dL   Alkaline Phosphatase 67 44 - 121 IU/L   AST 17 0 - 40 IU/L   ALT 18 0 - 32 IU/L  Sed Rate (ESR)     Status: None   Collection Time: 02/14/22  8:27 AM  Result Value Ref Range   Sed Rate 24 0 - 40 mm/hr  ANA Direct w/Reflex if Positive     Status: None   Collection Time: 02/14/22  8:27 AM  Result Value Ref Range   Anti Nuclear Antibody (ANA) Negative Negative  FSH/LH     Status: None   Collection Time: 02/14/22  8:34 AM  Result Value Ref Range   LH 51.0 mIU/mL    Comment:                     Adult Female:                       Follicular phase      2.4 -  12.6                       Ovulation phase       14.0 -  95.6                       Luteal phase          1.0 -  11.4                       Postmenopausal        7.7 -  58.5    FSH 141.0 mIU/mL    Comment:                     Adult Female:                       Follicular phase      3.5 -  12.5                       Ovulation phase       4.7 -  21.5  Luteal phase          1.7 -   7.7                       Postmenopausal       25.8 - 134.8   Estrogens, total     Status: None   Collection Time: 02/14/22  8:34 AM  Result Value Ref Range   Estrogen 119 pg/mL    Comment:                          Prepubertal             < 40                          Female Cycle:                            1-10 Days         16 - 328                            11-20 Days        34 - 501                            21-30 Days        48 - 350                            Post-Menopausal   40 - 244   Vitamin D (25 hydroxy)     Status: None   Collection Time: 03/14/22  3:13 PM  Result Value Ref Range   Vit D, 25-Hydroxy 30.6 30.0 - 100.0 ng/mL    Comment: Vitamin D deficiency has been defined by the [place] and an Endocrine Society practice guideline as a level of serum 25-OH vitamin D less than 20 ng/mL (1,2). The Endocrine Society went on to further define vitamin D insufficiency as a level between 21 and 29 ng/mL (2). 1. IOM (Institute of Medicine). 2010. Dietary reference    intakes for calcium and D. [place]: The    [employer]. 2. Holick MF, Binkley [place], Bischoff-Ferrari HA, et al.    Evaluation, treatment, and prevention of vitamin D    deficiency: an Endocrine Society clinical practice    guideline. JCEM. 2011 Jul; 96(7):1911-30.   B12 and Folate Panel     Status: Abnormal   Collection Time: 03/14/22  3:13 PM  Result Value Ref Range   Vitamin B-12 202 (L) 232 - 1,245 pg/mL   Folate 13.9 >3.0 ng/mL    Comment: A serum folate concentration of less than 3.1 ng/mL is considered to represent  clinical deficiency.   Fe+TIBC+Fer     Status: None   Collection Time: 03/14/22  3:13 PM  Result Value Ref Range   Total Iron Binding Capacity 292 250 - 450 ug/dL   UIBC 229 131 - 425 ug/dL   Iron 63 27 - 159 ug/dL   Iron Saturation 22 15 - 55 %   Ferritin 100 15 - 150 ng/mL       Assessment/Plan: 1. Encounter for general adult medical examination with abnormal findings All PHM is updated, mammogram, colonoscopy on next  visit, BMD  2. Other primary ovarian failure Suffering from atrophic vaginitis we will continue on HRT for now get bone density and evaluate for further management might need to see GYN - DG Bone Density; Future  3. Mixed hyperlipidemia Patient interested in lifestyle changes with diet and exercise will monitor for now might need statin if these numbers do not improve  4. Mild intermittent reactive airway disease without complication Has reactive airway disease we will refill her - Ipratropium-Albuterol (COMBIVENT RESPIMAT) 20-100 MCG/ACT AERS respimat; Take 2 puffs 3 x day for asthma  Dispense: 4 g; Refill: 3  5. 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 Elevated serum free T4 level Discussed her thyroid ultrasound there is no significant nodule we will continue to monitor  8. Body mass index (BMI) 38.0-38.9, adult Obesity Counseling: Risk Assessment: An assessment of behavioral risk factors was made today and includes lack of exercise sedentary lifestyle, lack of portion control and poor dietary habits.  Risk Modification Advice: She was counseled on portion control guidelines. Restricting daily caloric intake to 1200KCAL. The detrimental long term effects of  obesity on her health and ongoing poor compliance was also discussed with the patient.    9. Dysuria - UA/M w/rflx Culture, Routine - Microscopic Examination   General Counseling: Bettie verbalizes understanding of the findings of todays visit and agrees with plan of treatment. I have discussed any further diagnostic evaluation that may be needed or ordered today. We also reviewed her medications today. she has been encouraged to call the office with any questions or concerns that should arise related to todays visit.    Counseling:  [place] Controlled Substance Database was reviewed by me.  Orders Placed This Encounter  Procedures  . UA/M w/rflx Culture, Routine    Meds ordered this encounter  Medications  . cyanocobalamin (VITAMIN B12) injection 1,000 mcg    Total time spent:45 Minutes  Time spent includes review of chart, medications, test results, and follow up plan with the patient.     Lavera Guise, MD  Internal Medicine

## 2022-05-04 LAB — UA/M W/RFLX CULTURE, ROUTINE
Bilirubin, UA: NEGATIVE
Glucose, UA: NEGATIVE
Ketones, UA: NEGATIVE
Leukocytes,UA: NEGATIVE
Nitrite, UA: NEGATIVE
Protein,UA: NEGATIVE
RBC, UA: NEGATIVE
Specific Gravity, UA: 1.022 (ref 1.005–1.030)
Urobilinogen, Ur: 0.2 mg/dL (ref 0.2–1.0)
pH, UA: 6.5 (ref 5.0–7.5)

## 2022-05-04 LAB — MICROSCOPIC EXAMINATION
Bacteria, UA: NONE SEEN
Casts: NONE SEEN /lpf
RBC, Urine: NONE SEEN /hpf (ref 0–2)

## 2022-05-04 NOTE — Progress Notes (Signed)
Normal

## 2022-05-26 ENCOUNTER — Other Ambulatory Visit: Payer: Self-pay | Admitting: Internal Medicine

## 2022-05-26 DIAGNOSIS — G43109 Migraine with aura, not intractable, without status migrainosus: Secondary | ICD-10-CM

## 2022-06-01 ENCOUNTER — Ambulatory Visit: Payer: BC Managed Care – PPO

## 2022-06-02 ENCOUNTER — Ambulatory Visit: Payer: BC Managed Care – PPO

## 2022-06-13 ENCOUNTER — Telehealth: Payer: Self-pay | Admitting: Internal Medicine

## 2022-06-13 NOTE — Telephone Encounter (Signed)
Lvm notifying patient that 06/30/22 appointment has been moved to same day with AA @ 2:20-Toni

## 2022-06-28 ENCOUNTER — Ambulatory Visit: Payer: BLUE CROSS/BLUE SHIELD | Admitting: Internal Medicine

## 2022-06-30 ENCOUNTER — Ambulatory Visit: Payer: BLUE CROSS/BLUE SHIELD

## 2022-06-30 ENCOUNTER — Ambulatory Visit: Payer: BLUE CROSS/BLUE SHIELD | Admitting: Nurse Practitioner

## 2022-07-02 ENCOUNTER — Other Ambulatory Visit: Payer: Self-pay | Admitting: Internal Medicine

## 2022-07-12 ENCOUNTER — Other Ambulatory Visit: Payer: Self-pay

## 2022-07-12 ENCOUNTER — Encounter: Payer: Self-pay | Admitting: Internal Medicine

## 2022-07-12 ENCOUNTER — Ambulatory Visit: Payer: BLUE CROSS/BLUE SHIELD | Admitting: Internal Medicine

## 2022-07-12 VITALS — BP 139/71 | HR 81 | Temp 98.3°F | Resp 16 | Ht 61.5 in | Wt 199.2 lb

## 2022-07-12 DIAGNOSIS — Z6837 Body mass index (BMI) 37.0-37.9, adult: Secondary | ICD-10-CM

## 2022-07-12 DIAGNOSIS — G43009 Migraine without aura, not intractable, without status migrainosus: Secondary | ICD-10-CM | POA: Diagnosis not present

## 2022-07-12 DIAGNOSIS — E538 Deficiency of other specified B group vitamins: Secondary | ICD-10-CM | POA: Diagnosis not present

## 2022-07-12 MED ORDER — CYANOCOBALAMIN 1000 MCG/ML IJ SOLN
1000.0000 ug | Freq: Once | INTRAMUSCULAR | Status: AC
  Administered 2022-07-12: 1000 ug via INTRAMUSCULAR

## 2022-07-12 NOTE — Progress Notes (Signed)
[facility] [place], [place] [address]  Internal MEDICINE  Office Visit Note  Patient Name: Natasha Vargas  [id number]  [id number]  Date of Service: 07/12/2022  Chief Complaint  Patient presents with   Follow-up    Pt is wondering if a papsmear may be done in office today or at next physical.   Depression   Anxiety   Quality Metric Gaps    TDAP and Shingles Vaccines    HPI Pt is here for routine for follow up Continues to have headaches, she has history of migraine headaches since a very young age, patient also has burning smell sensation, there is no history of seizure in the past she has never been investigated for petit mall seizure as well Has done well on her weight, making conscious effort to eat healthy and been more active has lost 6 pounds since the last visit HRT is also helping with atrophic vaginitis improving but slow response Continues to be on B12 injection replacement therapy    Current Medication: Outpatient Encounter Medications as of 07/12/2022  Medication Sig   conjugated estrogens (PREMARIN) vaginal cream Use 1/3 of the applicator once a week intravaginal   estradiol (VIVELLE-DOT) 0.05 MG/24HR patch USE ONE PATCH ON DRY SKIN TWICE A WEEK   Ipratropium-Albuterol (COMBIVENT RESPIMAT) 20-100 MCG/ACT AERS respimat Take 2 puffs 3 x day for asthma   topiramate (TOPAMAX) 25 MG tablet Take one tab in am and 2 at bed time   [EXPIRED] cyanocobalamin (VITAMIN B12) injection 1,000 mcg    No facility-administered encounter medications on file as of 07/12/2022.    Surgical History: Past Surgical History:  Procedure Laterality Date   CHOLECYSTECTOMY  2004    Medical History: Past Medical History:  Diagnosis Date   Anxiety    Arthritis    Depression    Migraines     Family History: Family History  Problem Relation Age of Onset   Hypertension Mother    Heart disease Mother    Diabetes Mother    Glaucoma Mother    Obesity Father     Hypertension Father     Social History   Socioeconomic History   Marital status: Married    Spouse name: Not on file   Number of children: Not on file   Years of education: Not on file   Highest education level: Not on file  Occupational History   Not on file  Tobacco Use   Smoking status: Never    Passive exposure: Never   Smokeless tobacco: Never  Substance and Sexual Activity   Alcohol use: Not on file   Drug use: Never   Sexual activity: Yes  Other Topics Concern   Not on file  Social History Narrative   Not on file   Social Determinants of Health   Financial Resource Strain: Not on file  Food Insecurity: Not on file  Transportation Needs: Not on file  Physical Activity: Not on file  Stress: Not on file  Social Connections: Not on file  Intimate Partner Violence: Not on file      Review of Systems  Constitutional:  Negative for chills, fatigue and unexpected weight change.  HENT:  Positive for postnasal drip. Negative for congestion, rhinorrhea, sneezing and sore throat.   Eyes:  Negative for redness.  Respiratory:  Negative for cough, chest tightness and shortness of breath.   Cardiovascular:  Negative for chest pain and palpitations.  Gastrointestinal:  Negative for abdominal pain, constipation, diarrhea,  nausea and vomiting.  Genitourinary:  Negative for dysuria and frequency.  Musculoskeletal:  Negative for arthralgias, back pain, joint swelling and neck pain.  Skin:  Negative for rash.  Neurological: Negative.  Negative for tremors and numbness.  Hematological:  Negative for adenopathy. Does not bruise/bleed easily.  Psychiatric/Behavioral:  Negative for behavioral problems (Depression), sleep disturbance and suicidal ideas. The patient is not nervous/anxious.     Vital Signs: BP 139/71   Pulse 81   Temp 98.3 F (36.8 C)   Resp 16   Ht 5' 1.5" (1.562 m)   Wt 199 lb 3.2 oz (90.4 kg)   LMP  (Approximate)   SpO2 98%   BMI 37.03 kg/m     Physical Exam Constitutional:      Appearance: Normal appearance.  HENT:     Head: Normocephalic and atraumatic.     Nose: Nose normal.     Mouth/Throat:     Mouth: Mucous membranes are moist.     Pharynx: No posterior oropharyngeal erythema.  Eyes:     Extraocular Movements: Extraocular movements intact.     Pupils: Pupils are equal, round, and reactive to light.  Cardiovascular:     Pulses: Normal pulses.     Heart sounds: Normal heart sounds.  Pulmonary:     Effort: Pulmonary effort is normal.     Breath sounds: Normal breath sounds.  Neurological:     General: No focal deficit present.     Mental Status: She is alert.  Psychiatric:        Mood and Affect: Mood normal.        Behavior: Behavior normal.        Assessment/Plan: 1. Atypical migraine Patient has longstanding history of migraine headache with left-sided facial pain sensation of altered smell, recent worsening in these headaches , patient has been started on Topamax 25 mg in the morning and 50 at bedtime however I am a little worried,  patient might be having petit mall seizure and needs to have a EEG, sleep deprived EEG, MRI done and  - Ambulatory referral to Neurology  2. B12 deficiency Continue as before  - cyanocobalamin (VITAMIN B12) injection 1,000 mcg  3. BMI 37.0-37.9, adult Obesity Counseling: Risk Assessment: An assessment of behavioral risk factors was made today and includes lack of exercise sedentary lifestyle, lack of portion control and poor dietary habits.  Risk Modification Advice: She was counseled on portion control guidelines. Restricting daily caloric intake to 1200. The detrimental long term effects of obesity on her health and ongoing poor compliance was also discussed with the patient.      General Counseling: [name] verbalizes understanding of the findings of todays visit and agrees with plan of treatment. I have discussed any further diagnostic evaluation that may be needed  or ordered today. We also reviewed her medications today. she has been encouraged to call the office with any questions or concerns that should arise related to todays visit.    Orders Placed This Encounter  Procedures   Ambulatory referral to Neurology    Meds ordered this encounter  Medications   cyanocobalamin (VITAMIN B12) injection 1,000 mcg    Total time spent:35 Minutes Time spent includes review of chart, medications, test results, and follow up plan with the patient.   [place] Controlled Substance Database was reviewed by me.   Dr Lavera Guise Internal medicine

## 2022-07-14 ENCOUNTER — Telehealth: Payer: Self-pay | Admitting: Internal Medicine

## 2022-07-14 NOTE — Telephone Encounter (Signed)
Neurology referral faxed to [facility] due to insurance ; [phone]

## 2022-07-28 ENCOUNTER — Ambulatory Visit (INDEPENDENT_AMBULATORY_CARE_PROVIDER_SITE_OTHER): Payer: BLUE CROSS/BLUE SHIELD

## 2022-07-28 DIAGNOSIS — E538 Deficiency of other specified B group vitamins: Secondary | ICD-10-CM | POA: Diagnosis not present

## 2022-07-28 MED ORDER — CYANOCOBALAMIN 1000 MCG/ML IJ SOLN
1000.0000 ug | Freq: Once | INTRAMUSCULAR | Status: AC
  Administered 2022-07-28: 1000 ug via INTRAMUSCULAR

## 2022-08-02 ENCOUNTER — Telehealth: Payer: Self-pay | Admitting: Internal Medicine

## 2022-08-02 ENCOUNTER — Encounter: Payer: Self-pay | Admitting: Internal Medicine

## 2022-08-02 NOTE — Telephone Encounter (Signed)
Patient sent message stating [facility] neurology cannot get her in until January of 2025. I explained to her due to her insurance, she has to go to [facility]. Advised her to call her insurance to see if there is another facility she can go to and let me know-Toni

## 2022-08-23 ENCOUNTER — Inpatient Hospital Stay
Admission: RE | Admit: 2022-08-23 | Discharge: 2022-08-23 | Disposition: A | Discharge status: Home / Self Care | Payer: Self-pay | Source: Ambulatory Visit | Attending: *Deleted | Admitting: *Deleted

## 2022-08-23 ENCOUNTER — Other Ambulatory Visit: Payer: Self-pay | Admitting: *Deleted

## 2022-08-23 ENCOUNTER — Telehealth: Payer: Self-pay | Admitting: Internal Medicine

## 2022-08-23 ENCOUNTER — Other Ambulatory Visit: Payer: Self-pay | Admitting: Internal Medicine

## 2022-08-23 ENCOUNTER — Ambulatory Visit
Admission: RE | Admit: 2022-08-23 | Discharge: 2022-08-23 | Disposition: A | Discharge status: Home / Self Care | Payer: BLUE CROSS/BLUE SHIELD | Source: Ambulatory Visit | Attending: Internal Medicine | Admitting: Internal Medicine

## 2022-08-23 ENCOUNTER — Ambulatory Visit: Payer: Self-pay

## 2022-08-23 DIAGNOSIS — Z1231 Encounter for screening mammogram for malignant neoplasm of breast: Secondary | ICD-10-CM

## 2022-08-23 DIAGNOSIS — N6310 Unspecified lump in the right breast, unspecified quadrant: Secondary | ICD-10-CM

## 2022-08-23 NOTE — Telephone Encounter (Signed)
Patient called this morning stating she was @ [facility] for her screening mammogram. When she told them there was an issue with one of her breast, order had to bee changed to diagnostic with ultrasound. Orders completed. When [place] receives previous mammogram from [facility], they will call patient to schedule. I notified patient-Toni

## 2022-08-24 ENCOUNTER — Other Ambulatory Visit: Payer: Self-pay | Admitting: Internal Medicine

## 2022-08-24 DIAGNOSIS — N6310 Unspecified lump in the right breast, unspecified quadrant: Secondary | ICD-10-CM

## 2022-08-30 ENCOUNTER — Ambulatory Visit: Payer: BLUE CROSS/BLUE SHIELD | Admitting: Internal Medicine

## 2022-09-01 ENCOUNTER — Ambulatory Visit
Admission: RE | Admit: 2022-09-01 | Discharge: 2022-09-01 | Disposition: A | Discharge status: Home / Self Care | Payer: BLUE CROSS/BLUE SHIELD | Source: Ambulatory Visit | Attending: Internal Medicine | Admitting: Internal Medicine

## 2022-09-01 ENCOUNTER — Ambulatory Visit (INDEPENDENT_AMBULATORY_CARE_PROVIDER_SITE_OTHER): Payer: BLUE CROSS/BLUE SHIELD

## 2022-09-01 DIAGNOSIS — N6310 Unspecified lump in the right breast, unspecified quadrant: Secondary | ICD-10-CM | POA: Diagnosis not present

## 2022-09-01 DIAGNOSIS — R928 Other abnormal and inconclusive findings on diagnostic imaging of breast: Secondary | ICD-10-CM | POA: Diagnosis not present

## 2022-09-01 DIAGNOSIS — N6489 Other specified disorders of breast: Secondary | ICD-10-CM | POA: Diagnosis not present

## 2022-09-01 DIAGNOSIS — E538 Deficiency of other specified B group vitamins: Secondary | ICD-10-CM | POA: Diagnosis not present

## 2022-09-01 MED ORDER — CYANOCOBALAMIN 1000 MCG/ML IJ SOLN
1000.0000 ug | Freq: Once | INTRAMUSCULAR | Status: AC
  Administered 2022-09-01: 1000 ug via INTRAMUSCULAR

## 2022-09-07 ENCOUNTER — Encounter: Payer: Self-pay | Admitting: Internal Medicine

## 2022-09-07 DIAGNOSIS — G43009 Migraine without aura, not intractable, without status migrainosus: Secondary | ICD-10-CM

## 2022-09-09 ENCOUNTER — Telehealth: Payer: Self-pay | Admitting: Internal Medicine

## 2022-09-09 NOTE — Telephone Encounter (Signed)
Neurology referral faxed to [facility] due to insurance ; [phone]-Toni 

## 2022-09-23 ENCOUNTER — Other Ambulatory Visit: Payer: Self-pay | Admitting: Internal Medicine

## 2022-09-29 ENCOUNTER — Ambulatory Visit: Payer: BLUE CROSS/BLUE SHIELD

## 2022-09-30 ENCOUNTER — Encounter: Payer: Self-pay | Admitting: Internal Medicine

## 2022-09-30 ENCOUNTER — Ambulatory Visit (INDEPENDENT_AMBULATORY_CARE_PROVIDER_SITE_OTHER): Payer: BLUE CROSS/BLUE SHIELD

## 2022-09-30 DIAGNOSIS — E538 Deficiency of other specified B group vitamins: Secondary | ICD-10-CM | POA: Diagnosis not present

## 2022-09-30 MED ORDER — CYANOCOBALAMIN 1000 MCG/ML IJ SOLN
1000.0000 ug | Freq: Once | INTRAMUSCULAR | Status: AC
  Administered 2022-09-30: 1000 ug via INTRAMUSCULAR

## 2022-10-05 ENCOUNTER — Other Ambulatory Visit: Payer: Self-pay

## 2022-10-13 ENCOUNTER — Encounter: Payer: Self-pay | Admitting: Physician Assistant

## 2022-10-13 ENCOUNTER — Ambulatory Visit (INDEPENDENT_AMBULATORY_CARE_PROVIDER_SITE_OTHER): Payer: BLUE CROSS/BLUE SHIELD | Admitting: Physician Assistant

## 2022-10-13 ENCOUNTER — Telehealth: Payer: Self-pay | Admitting: Nurse Practitioner

## 2022-10-13 VITALS — BP 112/70 | HR 80 | Temp 98.2°F | Resp 16 | Ht 61.5 in | Wt 196.6 lb

## 2022-10-13 DIAGNOSIS — G43009 Migraine without aura, not intractable, without status migrainosus: Secondary | ICD-10-CM | POA: Diagnosis not present

## 2022-10-13 DIAGNOSIS — J0100 Acute maxillary sinusitis, unspecified: Secondary | ICD-10-CM | POA: Diagnosis not present

## 2022-10-13 DIAGNOSIS — F411 Generalized anxiety disorder: Secondary | ICD-10-CM

## 2022-10-13 MED ORDER — AMOXICILLIN-POT CLAVULANATE 875-125 MG PO TABS: 1.0000 | ORAL_TABLET | Freq: Two times a day (BID) | ORAL | 0 refills | Status: DC

## 2022-10-13 MED ORDER — CITALOPRAM HYDROBROMIDE 10 MG PO TABS: 10.0000 mg | ORAL_TABLET | Freq: Every day | ORAL | 3 refills | Status: DC

## 2022-10-13 NOTE — Progress Notes (Signed)
[facility] [address] [place], [place] [address]  Internal MEDICINE  Office Visit Note  Patient Name: Natasha Vargas  [id number]  [id number]  Date of Service: 10/13/2022  Chief Complaint  Patient presents with   Follow-up   Depression   Sinusitis    Possible sinus infection, at first patient thought it was allergies, but no improvement after 2 weeks. Patient taking Claritin daily.   Fatigue    Fatigue has been more prevalent over the last month.    HPI Pt is here for routine follow up -Was outside 2 weeks ago while son was mowing and thinks that triggered her allergies, but this has continued and progressed and is now congested, tired, and having colored mucus and drainage, some cough, no ear pain -Using antihistamine, did try mucinex initially but not recently -Can't get into neurology until Jan 2025, open to paying out of pocket to see Dr. Malvin Johns instead if no other in network options prior to Jan. Needs referral sent to new location. -Continues to have migraines, vision changes despite seeing eye doctor and head pressure which was reason for referral -Was taking topamax and then stopped briefly and is back on it and seems to help some -Had mammogram  -Still wearing estradiol patches -has been feeling more anxious and losing temper. Does also feel stressed all the time and is tired. Husband does work second shift so sleep can be disrupted falling asleep on couch. States she used to take citalopram and did well with it and would like to restart  Current Medication: Outpatient Encounter Medications as of 10/13/2022  Medication Sig   amoxicillin-clavulanate (AUGMENTIN) 875-125 MG tablet Take 1 tablet by mouth 2 (two) times daily. Take with food.   citalopram (CELEXA) 10 MG tablet Take 1 tablet (10 mg total) by mouth daily.   conjugated estrogens (PREMARIN) vaginal cream Use 1/3 of the applicator once a week intravaginal   estradiol (VIVELLE-DOT) 0.05 MG/24HR patch  USE ONE PATCH ON DRY SKIN TWICE A WEEK   Ipratropium-Albuterol (COMBIVENT RESPIMAT) 20-100 MCG/ACT AERS respimat Take 2 puffs 3 x day for asthma   topiramate (TOPAMAX) 25 MG tablet Take one tab in am and 2 at bed time   No facility-administered encounter medications on file as of 10/13/2022.    Surgical History: Past Surgical History:  Procedure Laterality Date   CHOLECYSTECTOMY  2004    Medical History: Past Medical History:  Diagnosis Date   Anxiety    Arthritis    Depression    Migraines     Family History: Family History  Problem Relation Age of Onset   Hypertension Mother    Heart disease Mother    Diabetes Mother    Glaucoma Mother    Obesity Father    Hypertension Father     Social History   Socioeconomic History   Marital status: Married    Spouse name: Not on file   Number of children: Not on file   Years of education: Not on file   Highest education level: Not on file  Occupational History   Not on file  Tobacco Use   Smoking status: Never    Passive exposure: Never   Smokeless tobacco: Never  Substance and Sexual Activity   Alcohol use: Not on file   Drug use: Never   Sexual activity: Yes  Other Topics Concern   Not on file  Social History Narrative   Not on file   Social Determinants of Health  Financial Resource Strain: Not on file  Food Insecurity: Not on file  Transportation Needs: Not on file  Physical Activity: Not on file  Stress: Not on file  Social Connections: Not on file  Intimate Partner Violence: Not on file      Review of Systems  Constitutional:  Negative for chills, fatigue and unexpected weight change.  HENT:  Positive for congestion, postnasal drip and sinus pressure. Negative for rhinorrhea, sneezing and sore throat.   Eyes:  Negative for redness.  Respiratory:  Positive for cough. Negative for chest tightness and shortness of breath.   Cardiovascular:  Negative for chest pain and palpitations.  Gastrointestinal:   Negative for abdominal pain, constipation, diarrhea, nausea and vomiting.  Genitourinary:  Negative for dysuria and frequency.  Musculoskeletal:  Negative for arthralgias, back pain, joint swelling and neck pain.  Skin:  Negative for rash.  Neurological: Negative.  Negative for tremors and numbness.  Hematological:  Negative for adenopathy. Does not bruise/bleed easily.  Psychiatric/Behavioral:  Positive for sleep disturbance. Negative for behavioral problems (Depression) and suicidal ideas. The patient is nervous/anxious.     Vital Signs: BP 112/70   Pulse 80   Temp 98.2 F (36.8 C)   Resp 16   Ht 5' 1.5" (1.562 m)   Wt 196 lb 9.6 oz (89.2 kg)   SpO2 98%   BMI 36.55 kg/m    Physical Exam Constitutional:      Appearance: Normal appearance.  HENT:     Head: Normocephalic and atraumatic.     Nose: Nose normal.     Mouth/Throat:     Mouth: Mucous membranes are moist.     Pharynx: No posterior oropharyngeal erythema.  Eyes:     Extraocular Movements: Extraocular movements intact.     Pupils: Pupils are equal, round, and reactive to light.  Cardiovascular:     Rate and Rhythm: Normal rate and regular rhythm.     Pulses: Normal pulses.     Heart sounds: Normal heart sounds.  Pulmonary:     Effort: Pulmonary effort is normal.     Breath sounds: Normal breath sounds.  Neurological:     General: No focal deficit present.     Mental Status: She is alert.  Psychiatric:        Mood and Affect: Mood normal.        Behavior: Behavior normal.        Assessment/Plan: 1. GAD (generalized anxiety disorder) Will start on celexa and titrate as needed - citalopram (CELEXA) 10 MG tablet; Take 1 tablet (10 mg total) by mouth daily.  Dispense: 30 tablet; Refill: 3  2. Acute non-recurrent maxillary sinusitis Will start on augmentin BID with food and restart mucinex - amoxicillin-clavulanate (AUGMENTIN) 875-125 MG tablet; Take 1 tablet by mouth 2 (two) times daily. Take with food.   Dispense: 20 tablet; Refill: 0  3. Atypical migraine Will redirect neurology referral, continue topamax as before   General Counseling: Natasha Vargas verbalizes understanding of the findings of todays visit and agrees with plan of treatment. I have discussed any further diagnostic evaluation that may be needed or ordered today. We also reviewed her medications today. she has been encouraged to call the office with any questions or concerns that should arise related to todays visit.    No orders of the defined types were placed in this encounter.   Meds ordered this encounter  Medications   amoxicillin-clavulanate (AUGMENTIN) 875-125 MG tablet    Sig: Take 1 tablet by mouth  2 (two) times daily. Take with food.    Dispense:  20 tablet    Refill:  0   citalopram (CELEXA) 10 MG tablet    Sig: Take 1 tablet (10 mg total) by mouth daily.    Dispense:  30 tablet    Refill:  3    This patient was seen by Lynn Ito, PA-C in collaboration with Dr. Beverely Risen as a part of collaborative care agreement.   Total time spent:30 Minutes Time spent includes review of chart, medications, test results, and follow up plan with the patient.      Dr Lyndon Code Internal medicine

## 2022-10-13 NOTE — Telephone Encounter (Signed)
Patient stated [facility] cannot get her in until next year. Per patient's request, referral sent via [facility] to Dr. Malvin Johns with [facility]

## 2022-11-01 ENCOUNTER — Encounter: Payer: Self-pay | Admitting: Internal Medicine

## 2022-11-01 ENCOUNTER — Ambulatory Visit (INDEPENDENT_AMBULATORY_CARE_PROVIDER_SITE_OTHER): Payer: BLUE CROSS/BLUE SHIELD | Admitting: Internal Medicine

## 2022-11-01 VITALS — BP 130/85 | HR 74 | Temp 98.4°F | Resp 16 | Ht 61.5 in | Wt 201.2 lb

## 2022-11-01 DIAGNOSIS — N950 Postmenopausal bleeding: Secondary | ICD-10-CM | POA: Diagnosis not present

## 2022-11-01 DIAGNOSIS — Z79890 Hormone replacement therapy (postmenopausal): Secondary | ICD-10-CM | POA: Diagnosis not present

## 2022-11-01 DIAGNOSIS — E538 Deficiency of other specified B group vitamins: Secondary | ICD-10-CM

## 2022-11-01 DIAGNOSIS — N946 Dysmenorrhea, unspecified: Secondary | ICD-10-CM | POA: Diagnosis not present

## 2022-11-01 MED ORDER — PROGESTERONE MICRONIZED 100 MG PO CAPS: ORAL_CAPSULE | ORAL | 1 refills | Status: DC

## 2022-11-01 NOTE — Progress Notes (Signed)
[facility] [address] [place], [place] [address]  Internal MEDICINE  Office Visit Note  Patient Name: Natasha Vargas  [id number]  [id number]  Date of Service: 11/01/2022  Chief Complaint  Patient presents with   Acute Visit   Menstrual Problem    Patient has not had a menstrual cycle in 4 years. Experiencing episodes of heavy bleeding and spotting. Sharp pelvic pain.    HPI Pt is here for acute visit Pt is having bleeding, she has been menopausal and was started on estrogen HRT, has done well. Pt is not hysterectomized She suffers from severe atrophic vaginitis  C/o lower abdominal pain and cramping with bleeding     Current Medication: Outpatient Encounter Medications as of 11/01/2022  Medication Sig   amoxicillin-clavulanate (AUGMENTIN) 875-125 MG tablet Take 1 tablet by mouth 2 (two) times daily. Take with food.   citalopram (CELEXA) 10 MG tablet Take 1 tablet (10 mg total) by mouth daily.   conjugated estrogens (PREMARIN) vaginal cream Use 1/3 of the applicator once a week intravaginal   estradiol (VIVELLE-DOT) 0.05 MG/24HR patch USE ONE PATCH ON DRY SKIN TWICE A WEEK   Ipratropium-Albuterol (COMBIVENT RESPIMAT) 20-100 MCG/ACT AERS respimat Take 2 puffs 3 x day for asthma   topiramate (TOPAMAX) 25 MG tablet Take one tab in am and 2 at bed time   No facility-administered encounter medications on file as of 11/01/2022.    Surgical History: Past Surgical History:  Procedure Laterality Date   CHOLECYSTECTOMY  2004    Medical History: Past Medical History:  Diagnosis Date   Anxiety    Arthritis    Depression    Migraines     Family History: Family History  Problem Relation Age of Onset   Hypertension Mother    Heart disease Mother    Diabetes Mother    Glaucoma Mother    Obesity Father    Hypertension Father     Social History   Socioeconomic History   Marital status: Married    Spouse name: Not on file   Number of children: Not on  file   Years of education: Not on file   Highest education level: Not on file  Occupational History   Not on file  Tobacco Use   Smoking status: Never    Passive exposure: Never   Smokeless tobacco: Never  Substance and Sexual Activity   Alcohol use: Not on file   Drug use: Never   Sexual activity: Yes  Other Topics Concern   Not on file  Social History Narrative   Not on file   Social Determinants of Health   Financial Resource Strain: Not on file  Food Insecurity: Not on file  Transportation Needs: Not on file  Physical Activity: Not on file  Stress: Not on file  Social Connections: Not on file  Intimate Partner Violence: Not on file      Review of Systems  Constitutional:  Negative for fatigue and fever.  HENT:  Negative for congestion, mouth sores and postnasal drip.   Respiratory:  Negative for cough.   Cardiovascular:  Negative for chest pain.  Genitourinary:  Positive for menstrual problem, pelvic pain and urgency. Negative for flank pain.  Psychiatric/Behavioral: Negative.      Vital Signs: BP 130/85   Pulse 74   Temp 98.4 F (36.9 C)   Resp 16   Ht 5' 1.5" (1.562 m)   Wt 201 lb 3.2 oz (91.3 kg)   SpO2 99%  BMI 37.40 kg/m    Physical Exam Constitutional:      Appearance: Normal appearance.  HENT:     Head: Normocephalic and atraumatic.  Eyes:     Extraocular Movements: Extraocular movements intact.     Pupils: Pupils are equal, round, and reactive to light.  Cardiovascular:     Rate and Rhythm: Normal rate.  Neurological:     Mental Status: She is alert.        Assessment/Plan: 1. Post-menopausal bleeding Most likely related to HRT, will add progesteron and monitor, might need pelvic u/s if continues to have bleeding  - CULTURE, URINE COMPREHENSIVE  2. Post-menopause on HRT (hormone replacement therapy) Will continue on combination estrogen and progesterone for now, might need non HRT if continues to bleed   3. B12 deficiency -  B12 and Folate Panel   General Counseling: Ashira verbalizes understanding of the findings of todays visit and agrees with plan of treatment. I have discussed any further diagnostic evaluation that may be needed or ordered today. We also reviewed her medications today. she has been encouraged to call the office with any questions or concerns that should arise related to todays visit.    Orders Placed This Encounter  Procedures   CULTURE, URINE COMPREHENSIVE    No orders of the defined types were placed in this encounter.   Total time spent:30 Minutes Time spent includes review of chart, medications, test results, and follow up plan with the patient.   [place] Controlled Substance Database was reviewed by me.   Dr Lyndon Code Internal medicine

## 2022-11-01 NOTE — Telephone Encounter (Signed)
Spoke with pt  and gave toni to make appt

## 2022-11-04 ENCOUNTER — Other Ambulatory Visit: Payer: Self-pay | Admitting: Physician Assistant

## 2022-11-04 DIAGNOSIS — F411 Generalized anxiety disorder: Secondary | ICD-10-CM

## 2022-11-05 LAB — CULTURE, URINE COMPREHENSIVE

## 2022-11-07 ENCOUNTER — Encounter: Payer: Self-pay | Admitting: Physician Assistant

## 2022-11-09 ENCOUNTER — Encounter: Payer: Self-pay | Admitting: Internal Medicine

## 2022-11-10 ENCOUNTER — Ambulatory Visit: Payer: BLUE CROSS/BLUE SHIELD | Admitting: Physician Assistant

## 2022-11-11 DIAGNOSIS — E538 Deficiency of other specified B group vitamins: Secondary | ICD-10-CM | POA: Diagnosis not present

## 2022-11-12 LAB — B12 AND FOLATE PANEL
Folate: 9.1 ng/mL (ref >3.0)
Vitamin B-12: 455 pg/mL (ref 232–1245)

## 2022-11-23 ENCOUNTER — Other Ambulatory Visit: Payer: Self-pay | Admitting: Internal Medicine

## 2022-11-24 ENCOUNTER — Telehealth: Payer: Self-pay | Admitting: Internal Medicine

## 2022-11-24 NOTE — Telephone Encounter (Signed)
Neurology appointment 11/22/22/24 @ [facility]

## 2022-12-11 ENCOUNTER — Other Ambulatory Visit: Payer: Self-pay | Admitting: Internal Medicine

## 2022-12-12 NOTE — Telephone Encounter (Signed)
Please review its ok to send med

## 2022-12-16 ENCOUNTER — Other Ambulatory Visit: Payer: Self-pay | Admitting: Neurology

## 2022-12-16 DIAGNOSIS — R42 Dizziness and giddiness: Secondary | ICD-10-CM

## 2022-12-16 DIAGNOSIS — Z8669 Personal history of other diseases of the nervous system and sense organs: Secondary | ICD-10-CM

## 2022-12-16 DIAGNOSIS — R519 Headache, unspecified: Secondary | ICD-10-CM

## 2022-12-16 DIAGNOSIS — H538 Other visual disturbances: Secondary | ICD-10-CM

## 2022-12-20 ENCOUNTER — Ambulatory Visit
Admission: RE | Admit: 2022-12-20 | Discharge: 2022-12-20 | Disposition: A | Discharge status: Home / Self Care | Payer: No Typology Code available for payment source | Source: Ambulatory Visit | Attending: Neurology | Admitting: Neurology

## 2022-12-20 DIAGNOSIS — R519 Headache, unspecified: Secondary | ICD-10-CM

## 2022-12-20 DIAGNOSIS — R42 Dizziness and giddiness: Secondary | ICD-10-CM

## 2022-12-20 DIAGNOSIS — H538 Other visual disturbances: Secondary | ICD-10-CM

## 2022-12-20 DIAGNOSIS — Z8669 Personal history of other diseases of the nervous system and sense organs: Secondary | ICD-10-CM

## 2023-01-03 NOTE — Progress Notes (Signed)
 Today the history is gathered from: 100% - patient  0% - alone  RECORDS SUMMARY: I have reviewed the note dated 10/13/2022 from Dr. Fernand who has indicated:  Atypical Migraine  Given these abnormal neurologic findings, a referral to neurology has been recommended.  REFERRING PHYSICIAN: Fernand Sigrid HERO, MD PRIMARY CARE PHYSICIAN:  Fernand Sigrid HERO, MD   IMPRESSION/PLAN  Ms. Natasha Vargas is a 52 y.o. female presenting for evaluation of  MIGRAINES/ BLURRY VISION/ DIZZINESS/ ANXIETY/ DEPRESSION/   - Ongoing. - Patient with headache occurring 1-2 times daily lasting all day. Endorses pressure sensation about headband region. Denies sharp shooting head pain or tinnitus. Notes ongoing blurry vision. Occasional dizzy spells that are transient. No secondary nausea or vomiting.  Denies balance and gait difficulty. Notes moderate anxiety and depression. States difficulty falling and staying asleep. Reports increased daytime drowsiness.  - Discussed MRI Brain WO, results were unremarkable and no structural abnormalities for headaches.  -  Start taking Maxalt 10 mg at headache onset. Take another tablet in 2 hours if needed. Take no more than 2 tablets in a 24 hour period.  - Increase Nortriptyline to 30 mg nightly for two weeks, if ineffective can increase to 40 mg nightly for headaches. Refill. - Can consider increasing Nortriptyline to 50 mg nightly for headache at future visit.  - Recommend not taking OTC medication more than twice weekly to reduce rebound headaches. - Recommend second consult with ophthalmologist to discuss blurry vision.   Medications previously tried:  Follow-up with Dr. Lane in 2 months  p=4    CHIEF COMPLAINT & HPI  Ms. Natasha Vargas is a 52 y.o. female presenting for evaluation of: Chief Complaint  Patient presents with  . Migraine  . Blurred Vision  . Dizziness  . Anxiety  . Depression   MIGRAINES/ BLURRY VISION/ DIZZINESS/ ANXIETY/ DEPRESSION/  Patient with ongoing  headaches with pressure sensation in headband region occuring 1-2 times daily Denies recent migraine episodes. No sharp shooting pain in head. Endorses increased blurry vision. Past opthalmology appointment in 2023, exam was normal. Denies diplopia, asphasia, memory change or tinnitus. States a few dizzy spells since last visit. Reports dizzy spells are transient lasting less than one minute. Denies nausea or vomiting secondary to dizziness. No balance or gait difficulty. Occasionally staying hydrated. Taking Nortriptyline 20 mg nightly. No side effects. No OTC medication use. States moderate anxiety and depression. Reports difficulty falling and staying asleep secondary to pressure about head. Increased daytime fatigue and drowsiness.  DATA SUMMARY: 12/20/2022 MRI BRAIN WO IMPRESSION:  1. No evidence of an acute intracranial abnormality.  2. Multifocal T2 FLAIR hyperintense signal abnormality within the  cerebral white matter, mild-to-moderate in severity and greater than  expected for age. These signal changes are nonspecific and  differential considerations include chronic small vessel ischemic  disease, sequelae of chronic migraine headaches, sequelae of a prior  infectious/inflammatory process and sequelae of demyelinating  disease, among others.   06/03/2020 MRI LUMBAR SPINE WO CONTRAST IMPRESSION:  1. Mild facet arthropathy L4-5 and L5-S1 with mild foraminal narrowing.  2.  No focal disc protrusion or high-grade central stenosis.    VISIT SUMMARIES:   MEDICATIONS Current Outpatient Medications  Medication Sig Dispense Refill  . citalopram  (CELEXA ) 10 MG tablet Take 10 mg by mouth once daily    . nortriptyline (PAMELOR) 10 MG capsule Start nortriptyline 10 milligrams at night for a week then increase to 20 milligrams at night and continue that dose 60 capsule 3  .  progesterone  (PROMETRIUM ) 100 MG capsule Take 100 mg by mouth once daily     No current facility-administered  medications for this visit.    ALLERGIES Allergies  Allergen Reactions  . Bee Pollen Unknown  . Latex Hives, Itching and Unknown    Skin peels   Skin peels   Skin peels   Skin peels   Skin peels   Skin peels   Skin peels   Other reaction(s): Unknown, Unknown  Skin peels   Skin peels   Skin peels   Skin peels   Skin peels   Skin peels   Skin peels   Skin peels   Skin peels  . Egg Unknown    headache  headache  . Wheat Bran Hives    Other reaction(s): Unknown     EXAM   Vitals:   01/03/23 1535  BP: 117/72  Pulse: 85  Weight: 93 kg (205 lb)  Height: 156.2 cm (5' 1.5)  PainSc:   2  PainLoc: Head - Frontal    Body mass index is 38.11 kg/m.  GENERAL: Pleasant female, in no acute distress. Normocephalic and atraumatic.   PAST MEDICAL HISTORY Past Medical History:  Diagnosis Date  . Anxiety   . Arthritis   . Depression   . Exercise-induced asthma (HHS-HCC)   . Migraine     PAST SURGICAL HISTORY Past Surgical History:  Procedure Laterality Date  . carpel tunnel release Right   . CHOLECYSTECTOMY    . INCISION TENDON SHEATH FOR TRIGGER FINGER Right     FAMILY HISTORY Family History  Problem Relation Name Age of Onset  . No Known Problems Mother    . No Known Problems Father      SOCIAL HISTORY  Social History   Tobacco Use  . Smoking status: Former    Current packs/day: 0.00    Types: Cigarettes    Quit date: 1993    Years since quitting: 31.5  . Smokeless tobacco: Never  Vaping Use  . Vaping status: Never Used  Substance Use Topics  . Alcohol use: Yes    Comment: occasional  . Drug use: Never     REVIEW OF SYSTEMS:  13 system ROS form was given to the patient to complete and I have reviewed it.  The form was sent for scan to the patient's EHR.  Pertinent positives and negatives are mentioned above in the HPI and all other systems are negative.   DATA  I have personally reviewed all of the data outlined below both prior to  the appointment and during the appointment with the patient as appropriate.  No visits with results within 6 Month(s) from this visit.  Latest known visit with results is:  No results found for any previous visit.      No follow-ups on file.  Payor: /   This note is partially written by Greig Pouch, in the presence of and acting as the scribe of Dr. Arthea Farrow.   I have reviewed, edited and added to the note as needed to reflect my best personal medical judgment.    Dr. Arthea Farrow, MD [facility] [place], [place] Ph:  [phone] Fax:  [phone]

## 2023-02-05 ENCOUNTER — Other Ambulatory Visit: Payer: Self-pay | Admitting: Internal Medicine

## 2023-02-05 DIAGNOSIS — F411 Generalized anxiety disorder: Secondary | ICD-10-CM

## 2023-03-02 ENCOUNTER — Other Ambulatory Visit: Payer: Self-pay | Admitting: Internal Medicine

## 2023-04-06 ENCOUNTER — Encounter: Payer: Self-pay | Admitting: Internal Medicine

## 2023-04-06 NOTE — Telephone Encounter (Signed)
Spoke with Pt and make her appt with lauren for referral for GYN

## 2023-04-07 ENCOUNTER — Ambulatory Visit (INDEPENDENT_AMBULATORY_CARE_PROVIDER_SITE_OTHER): Payer: BLUE CROSS/BLUE SHIELD | Admitting: Physician Assistant

## 2023-04-07 ENCOUNTER — Encounter: Payer: Self-pay | Admitting: Physician Assistant

## 2023-04-07 ENCOUNTER — Telehealth: Payer: Self-pay

## 2023-04-07 VITALS — BP 129/81 | HR 99 | Temp 98.1°F | Resp 16 | Ht 61.5 in | Wt 212.0 lb

## 2023-04-07 DIAGNOSIS — R5383 Other fatigue: Secondary | ICD-10-CM

## 2023-04-07 DIAGNOSIS — N950 Postmenopausal bleeding: Secondary | ICD-10-CM | POA: Diagnosis not present

## 2023-04-07 DIAGNOSIS — N939 Abnormal uterine and vaginal bleeding, unspecified: Secondary | ICD-10-CM

## 2023-04-07 DIAGNOSIS — E538 Deficiency of other specified B group vitamins: Secondary | ICD-10-CM | POA: Diagnosis not present

## 2023-04-07 DIAGNOSIS — E782 Mixed hyperlipidemia: Secondary | ICD-10-CM | POA: Diagnosis not present

## 2023-04-07 DIAGNOSIS — R7989 Other specified abnormal findings of blood chemistry: Secondary | ICD-10-CM

## 2023-04-07 NOTE — Progress Notes (Unsigned)
[facility] [address] [place], [place] [address]  Internal MEDICINE  Office Visit Note  Patient Name: Natasha Vargas  [id number]  [id number]  Date of Service: 04/10/2023  Chief Complaint  Patient presents with   Acute Visit   Menstrual Problem   Referral    OB/GYN     HPI Pt is here for a sick visit. -had a cylce in may, first time in over 4 years and saw Dr.Khan and was given med to stop it and it worked -Then 1 month ago breast became very tender, on Sunday started having pale pink on tissue and now yesterday and today had had some bleeding. Not as bad as it was in May, not as painful -having hair loss as well -taking nortriptyline for headaches, not Topamax and working well -only took progesterone for 30 days and bleeding stopped, has still been using patches still though. Will stop this now. As HRT may be reason for irregular bleeding. Will go ahead and send to GYN -Will also check labs  Current Medication:  Outpatient Encounter Medications as of 04/07/2023  Medication Sig   amoxicillin-clavulanate (AUGMENTIN) 875-125 MG tablet Take 1 tablet by mouth 2 (two) times daily. Take with food.   citalopram (CELEXA) 10 MG tablet TAKE 1 TABLET BY MOUTH EVERY DAY   Ipratropium-Albuterol (COMBIVENT RESPIMAT) 20-100 MCG/ACT AERS respimat Take 2 puffs 3 x day for asthma   nortriptyline (PAMELOR) 10 MG capsule Take 40 mg by mouth at bedtime.   [DISCONTINUED] conjugated estrogens (PREMARIN) vaginal cream Use 1/3 of the applicator once a week intravaginal   [DISCONTINUED] estradiol (VIVELLE-DOT) 0.05 MG/24HR patch USE ONE PATCH ON DRY SKIN TWICE A WEEK   [DISCONTINUED] progesterone (PROMETRIUM) 100 MG capsule Take one tab po qhs   [DISCONTINUED] topiramate (TOPAMAX) 25 MG tablet Take one tab in am and 2 at bed time   No facility-administered encounter medications on file as of 04/07/2023.      Medical History: Past Medical History:  Diagnosis Date   Anxiety     Arthritis    Depression    Migraines      Vital Signs: BP 129/81   Pulse 99   Temp 98.1 F (36.7 C)   Resp 16   Ht 5' 1.5" (1.562 m)   Wt 212 lb (96.2 kg)   SpO2 97%   BMI 39.41 kg/m    Review of Systems  Constitutional:  Negative for fatigue and fever.  HENT:  Negative for congestion, mouth sores and postnasal drip.   Respiratory:  Negative for cough.   Cardiovascular:  Negative for chest pain.  Genitourinary:  Positive for menstrual problem and vaginal bleeding. Negative for flank pain.  Psychiatric/Behavioral: Negative.      Physical Exam Vitals and nursing note reviewed.  Constitutional:      Appearance: Normal appearance.  HENT:     Head: Normocephalic and atraumatic.  Eyes:     Extraocular Movements: Extraocular movements intact.     Pupils: Pupils are equal, round, and reactive to light.  Cardiovascular:     Rate and Rhythm: Normal rate.  Neurological:     Mental Status: She is alert.  Psychiatric:        Mood and Affect: Mood normal.        Behavior: Behavior normal.       Assessment/Plan: 1. Post-menopausal bleeding Will stop HRT and refer to GYN, will likely need [place] and if long wait then pt may call and will go ahead  and order - Ambulatory referral to Obstetrics / Gynecology - FSH/LH - Estradiol  2. B12 deficiency - B12 and Folate Panel  3. Elevated serum free T4 level - TSH + free T4  4. Mixed hyperlipidemia - Lipid Panel With LDL/HDL Ratio  5. Other fatigue - CBC w/Diff/Platelet - Comprehensive metabolic panel - TSH + free T4 - Lipid Panel With LDL/HDL Ratio - FSH/LH - B12 and Folate Panel - Estradiol   General Counseling: Natasha Vargas verbalizes understanding of the findings of todays visit and agrees with plan of treatment. I have discussed any further diagnostic evaluation that may be needed or ordered today. We also reviewed her medications today. she has been encouraged to call the office with any questions or concerns that should  arise related to todays visit.    Counseling:    Orders Placed This Encounter  Procedures   CBC w/Diff/Platelet   Comprehensive metabolic panel   TSH + free T4   Lipid Panel With LDL/HDL Ratio   FSH/LH   B12 and Folate Panel   Estradiol   Ambulatory referral to Obstetrics / Gynecology    No orders of the defined types were placed in this encounter.   Time spent:30 Minutes

## 2023-04-07 NOTE — Telephone Encounter (Signed)
As per lauren advised pt that stopped estradiol patch and also we order labs do fasting

## 2023-04-25 ENCOUNTER — Other Ambulatory Visit (HOSPITAL_COMMUNITY)
Admission: RE | Admit: 2023-04-25 | Discharge: 2023-04-25 | Disposition: A | Discharge status: Home / Self Care | Payer: BLUE CROSS/BLUE SHIELD | Source: Ambulatory Visit | Attending: Obstetrics & Gynecology | Admitting: Obstetrics & Gynecology

## 2023-04-25 ENCOUNTER — Ambulatory Visit: Payer: BLUE CROSS/BLUE SHIELD | Admitting: Obstetrics & Gynecology

## 2023-04-25 ENCOUNTER — Encounter: Payer: Self-pay | Admitting: Obstetrics & Gynecology

## 2023-04-25 VITALS — BP 126/69 | HR 87 | Ht 61.0 in | Wt 210.0 lb

## 2023-04-25 DIAGNOSIS — Z124 Encounter for screening for malignant neoplasm of cervix: Secondary | ICD-10-CM | POA: Insufficient documentation

## 2023-04-25 DIAGNOSIS — N950 Postmenopausal bleeding: Secondary | ICD-10-CM

## 2023-04-25 DIAGNOSIS — C541 Malignant neoplasm of endometrium: Secondary | ICD-10-CM | POA: Diagnosis not present

## 2023-04-25 NOTE — Addendum Note (Signed)
Addended by: Cornelius Moras D on: 04/25/2023 10:47 AM   Modules accepted: Orders

## 2023-04-25 NOTE — Progress Notes (Signed)
    GYNECOLOGY PROGRESS NOTE  Subjective:    Patient ID: Natasha Vargas, female    DOB: Dec 11, 1970, 52 y.o.   MRN: [id number]  HPI  Patient is a 52 y.o. married G1P0101 here as a new patient for evaluation of PMB. She went through menopause around 4 years ago. 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 She was prescribed vaginal estrogen but told her primary care provider that she still had the dyspareunia. She was prescribed estrogen patch. She used this (no progesterone) until prometrium was prescribed 10/2022. She started PMB about 2 months ago. She was given 30 days of provera.   The following portions of the patient's history were reviewed and updated as appropriate: allergies, current medications, past family history, past medical history, past social history, past surgical history, and problem list.  Review of Systems Pertinent items are noted in HPI.  She is followed by a neurologist, has migraine with aura. Objective:   Blood pressure 126/69, pulse 87, height 5\' 1"  (1.549 m), weight 210 lb (95.3 kg). Body mass index is 39.68 kg/m. Well nourished, well hydrated White female, no apparent distress She is ambulating and conversing normally. BMI 39  Consent signed, time out done Cervix prepped with betadine and sprayed with Hurricaine spray. I then grasped with a single tooth tenaculum. Uterus sounded to 9 cm Pipelle used for 2 passes with a very large amount of tissue obtained. She tolerated the procedure well.    Assessment:   PMB  Plan:   Await pathology results Schedule pelvic ultrasound Advised her never to take systemic estrogen due to migraine with aura She will follow up here with a gynecologist after the ultrasound results are available.

## 2023-04-27 LAB — CYTOLOGY - PAP
Comment: NEGATIVE
Diagnosis: NEGATIVE
High risk HPV: NEGATIVE

## 2023-04-28 LAB — SURGICAL PATHOLOGY

## 2023-05-01 ENCOUNTER — Other Ambulatory Visit: Payer: Self-pay | Admitting: Obstetrics & Gynecology

## 2023-05-01 DIAGNOSIS — C541 Malignant neoplasm of endometrium: Secondary | ICD-10-CM

## 2023-05-01 NOTE — Progress Notes (Signed)
I spoke with Mrs. Natasha Vargas and discussed the surgical pathology result, adenocarcinoma. I explained that she should keep her ultrasound appt and I have made a referral to gyn oncologist.

## 2023-05-03 ENCOUNTER — Inpatient Hospital Stay: Payer: BLUE CROSS/BLUE SHIELD | Attending: Oncology | Admitting: Obstetrics and Gynecology

## 2023-05-03 DIAGNOSIS — E559 Vitamin D deficiency, unspecified: Secondary | ICD-10-CM | POA: Diagnosis not present

## 2023-05-03 DIAGNOSIS — F419 Anxiety disorder, unspecified: Secondary | ICD-10-CM | POA: Insufficient documentation

## 2023-05-03 DIAGNOSIS — Z6841 Body Mass Index (BMI) 40.0 and over, adult: Secondary | ICD-10-CM | POA: Diagnosis not present

## 2023-05-03 DIAGNOSIS — N950 Postmenopausal bleeding: Secondary | ICD-10-CM | POA: Diagnosis not present

## 2023-05-03 DIAGNOSIS — M1990 Unspecified osteoarthritis, unspecified site: Secondary | ICD-10-CM | POA: Diagnosis not present

## 2023-05-03 DIAGNOSIS — C541 Malignant neoplasm of endometrium: Secondary | ICD-10-CM

## 2023-05-03 DIAGNOSIS — N941 Unspecified dyspareunia: Secondary | ICD-10-CM | POA: Diagnosis not present

## 2023-05-03 DIAGNOSIS — E785 Hyperlipidemia, unspecified: Secondary | ICD-10-CM | POA: Diagnosis not present

## 2023-05-03 DIAGNOSIS — M419 Scoliosis, unspecified: Secondary | ICD-10-CM | POA: Diagnosis not present

## 2023-05-03 DIAGNOSIS — Z79899 Other long term (current) drug therapy: Secondary | ICD-10-CM | POA: Insufficient documentation

## 2023-05-03 DIAGNOSIS — F32A Depression, unspecified: Secondary | ICD-10-CM | POA: Diagnosis not present

## 2023-05-03 NOTE — Progress Notes (Signed)
Gynecologic Oncology Consult Visit   Referring Provider: Dr, Marice Potter  Chief Concern: PMB  Subjective:  Natasha Vargas is a 52 y.o. female who is seen in consultation from Dr. Marice Potter for endometrial adenocarcinoma.  Patient developed PMB and saw Dr Marice Potter 04/25/23.  Patient is a 52 y.o. married G1P0101 here as a new patient for evaluation of PMB. She went through menopause around 4 years ago. 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 She was prescribed vaginal estrogen but told her primary care provider that she still had the dyspareunia. She was prescribed estrogen patch. She used this (no progesterone) until prometrium was prescribed 10/2022. She started PMB about 2 months ago. She was given 30 days of provera.   04/25/23 ENDOMETRIUM, BIOPSY: Endometrioid adenocarcinoma, FIGO grade 2. COMMENT: Histochemical stains reveal tumor cells are positive for ER, PR and show diffuse p16 staining.  P53 is wild-type.  Controls are adequate.  This case was reviewed with Dr. Reynolds Bowl who agrees with the above diagnosis   No bleeding now.  No other complaints.   Problem List: Patient Active Problem List   Diagnosis Date Noted   Endometrial cancer (HCC) 05/03/2023   BMI 35.0-35.9,adult 05/12/2021   Chest tightness 05/12/2021   Dyspnea on exertion 05/12/2021   Chronic pain 05/19/2020   Sciatica 05/19/2020   Severe obesity (BMI >= 40) (HCC) 04/12/2019   Anxiety and depression 04/12/2019   Severe anxiety 04/12/2019   Positive colorectal cancer screening using Cologuard test 02/13/2019   Shoulder pain, right 12/03/2018   Scoliosis 12/01/2018   Trigger finger of right thumb 12/10/2015   Vitamin D deficiency 12/07/2015   Severe obesity (BMI 35.0-35.9 with comorbidity) (HCC) 12/03/2015   Carpal tunnel syndrome on right 07/09/2015   Acquired hallux rigidus, left 12/30/2014   Acquired hallux rigidus, right 12/30/2014   Bilateral leg and foot pain 12/30/2014   MDD (major depressive  disorder) 12/23/2014   Hyperlipidemia 10/01/2014    Past Medical History: Past Medical History:  Diagnosis Date   Anxiety    Arthritis    Depression    Migraines     Past Surgical History: Past Surgical History:  Procedure Laterality Date   CHOLECYSTECTOMY  2004     OB History:  OB History  Gravida Para Term Preterm AB Living  1 1   1   1   SAB IAB Ectopic Multiple Live Births               # Outcome Date GA Lbr Len/2nd Weight Sex Type Anes PTL Lv  1 Preterm             Family History: Family History  Problem Relation Age of Onset   Hypertension Mother    Heart disease Mother    Diabetes Mother    Glaucoma Mother    Obesity Father    Hypertension Father     Social History: Social History   Socioeconomic History   Marital status: Married    Spouse name: Not on file   Number of children: Not on file   Years of education: Not on file   Highest education level: Not on file  Occupational History   Not on file  Tobacco Use   Smoking status: Never    Passive exposure: Never   Smokeless tobacco: Never  Substance and Sexual Activity   Alcohol use: Not on file   Drug use: Never   Sexual activity: Yes    Birth control/protection: Surgical  Other Topics  Concern   Not on file  Social History Narrative   Not on file   Social Determinants of Health   Financial Resource Strain: Not on file  Food Insecurity: Not on file  Transportation Needs: Not on file  Physical Activity: Not on file  Stress: Not on file  Social Connections: Unknown (10/17/2021)   Received from [facility], [facility]   Social Network    Social Network: Not on file  Intimate Partner Violence: Unknown (09/08/2021)   Received from [facility], [facility]   HITS    Physically Hurt: Not on file    Insult or Talk Down To: Not on file    Threaten Physical Harm: Not on file    Scream or Curse: Not on file    Allergies: Allergies  Allergen Reactions   Other     Artifical  Sweetners- Headaches   Omega-3 Other (See Comments)    Artifical Sweetners- Headaches  Artifical Sweetners- Headaches   Bee Pollen Other (See Comments)   Latex Hives and Itching    Skin peels   Other reaction(s): Unknown, Unknown  Skin peels   Skin peels   Skin peels   Skin peels   Skin peels   Skin peels   Skin peels   Skin peels   Skin peels   Skin peels   Skin peels   Skin peels   Skin peels   Skin peels   Skin peels     Skin peels  Skin peels  Skin peels  Skin peels     Skin peels  Skin peels     Other reaction(s): Unknown, Unknown Skin peels  Skin peels  Skin peels  Skin peels  Skin peels  Skin peels  Skin peels  Skin peels     Skin peels   Pollen Extract    Egg-Derived Products Other (See Comments)    headache  headache  headache  headache headache   Wheat Hives and Other (See Comments)    Other reaction(s): Unknown    Current Medications: Current Outpatient Medications  Medication Sig Dispense Refill   b complex vitamins capsule Take 1 capsule by mouth daily.     buPROPion (WELLBUTRIN XL) 300 MG 24 hr tablet      busPIRone (BUSPAR) 15 MG tablet Take by mouth.     citalopram (CELEXA) 10 MG tablet TAKE 1 TABLET BY MOUTH EVERY DAY 90 tablet 0   estradiol (ESTRACE) 0.1 MG/GM vaginal cream Use 1 g vaginally every night for 2 weeks then two to three times per week     Ipratropium-Albuterol (COMBIVENT RESPIMAT) 20-100 MCG/ACT AERS respimat Take 2 puffs 3 x day for asthma 4 g 3   Multiple Vitamins-Minerals (MULTIVITAMIN-MINERALS PO) Take by mouth.     nortriptyline (PAMELOR) 10 MG capsule Take 40 mg by mouth at bedtime.     rizatriptan (MAXALT) 10 MG tablet take 10 mg at headache onset. Take another tablet in 2 hours if needed. Take no more than 2 tablets in a 24 hour period.     SUMAtriptan (IMITREX) 100 MG tablet Take one tablet by mouth for migraine.  Repeat in 2 hrs if not effective. Max dose of 2 doses in 24hrs.     tiZANidine (ZANAFLEX) 4 MG tablet  Take by mouth.     VITAMIN D, CHOLECALCIFEROL, PO Take by mouth.     betamethasone dipropionate 0.05 % cream Apply topically.     fish oil-omega-3 fatty acids 1000 MG capsule Take by  mouth.     progesterone (PROMETRIUM) 100 MG capsule Take by mouth.     No current facility-administered medications for this visit.    Review of Systems General: negative for, fevers, chills, fatigue, changes in sleep, changes in weight or appetite Skin: negative for changes in color, texture, moles or lesions Eyes: negative for, changes in pain, diplopia. Positive for vision changes (chronic) HEENT: negative for, change in hearing, pain, discharge, tinnitus, vertigo, voice changes, sore throat, neck masses Pulmonary: negative for, dyspnea, orthopnea, productive cough Cardiac: negative for, palpitations, syncope, pain, discomfort, pressure. Positive for leg pain when walking Gastrointestinal: negative for, dysphagia, nausea, vomiting, jaundice, pain, constipation, diarrhea, hematemesis, hematochezia Genitourinary/Sexual: negative for, dysuria, discharge, hesitancy, nocturia, retention, stones, infections, STD's, incontinence Musculoskeletal: negative for, pain, stiffness, swelling, range of motion limitation. Positive for back pain.  Hematology: negative for, easy bruising, bleeding Neurologic/Psych: negative for, seizures, paralysis, weakness, tremor, change in gait, change in sensation, mood swings, depression, anxiety, change in memory. Positive for headaches.   Objective:  Physical Examination:  BP (!) 137/59   Pulse 79   Temp 98.6 F (37 C)   Resp 20   Wt 214 lb 8 oz (97.3 kg)   SpO2 100%   BMI 40.53 kg/m    ECOG Performance Status: 1 - Symptomatic but completely ambulatory  General appearance: alert, cooperative, and appears stated age HEENT:PERRLA and thyroid without masses Lymph node survey: non-palpable, axillary, inguinal, supraclavicular Cardiovascular: regular rate and rhythm, no  murmurs or gallops Respiratory: normal air entry, lungs clear to auscultation and no rales, rhonchi or wheezing Abdomen: soft, non-tender, without masses or organomegaly, nondistended, no hernias, and well healed incision Back: inspection of back is normal Extremities: extremities normal, atraumatic, no cyanosis or edema Skin exam - normal coloration and turgor, no rashes, no suspicious skin lesions noted. Neurological exam reveals alert, oriented, normal speech, no focal findings or movement disorder noted.  Pelvic: exam chaperoned by nurse, EGBUS within normal limits, normal vagina and vulva;  Bimanual; normal. Cervix: anteverted; Rectal: not indicated  Lab Review Labs on site today:     Assessment:  Eralynn Tewolde Dozer is a 52 y.o. P81 female with PMB diagnosed with grade 2 endometrial adenocarcinoma on EMB.    Medical co-morbidities complicating care: Marland Kitchen  Plan:   Problem List Items Addressed This Visit       Genitourinary   Endometrial cancer (HCC) - Primary   We discussed options for management including TLH, BSO, pelvic SLN mapping and biopsies 05/17/23. Possible pelvic PA node sampling, possible laparotomy.    The risks of surgery were discussed in detail and she understands these to include infection; wound separation; hernia; vaginal cuff separation, injury to adjacent organs such as bowel, bladder, blood vessels, ureters and nerves; bleeding which may require blood transfusion; anesthesia risk; thromboembolic events; possible death; unforeseen complications; possible need for re-exploration; medical complications such as heart attack, stroke, pleural effusion and pneumonia; and, if staging performed the risk of lymphedema and lymphocyst.  The patient will receive DVT and antibiotic prophylaxis as indicated.  She voiced a clear understanding.  She had the opportunity to ask questions and written informed consent was obtained today.    Suggested return to clinic post op.    The  patient's diagnosis, an outline of the further diagnostic and laboratory studies which will be required, the recommendation, and alternatives were discussed.  All questions were answered to the patient's satisfaction.  A total of 60 minutes were spent with the patient/family today;  50% was spent in education, counseling and coordination of care for uterine cancer.    Alinda Dooms, NP  I personally interviewed and examined the patient. Agreed with the above/below plan of care. I have directly contributed to assessment and plan of care of this patient and educated and discussed with patient and family.  Leida Lauth, MD  CC:  Allie Bossier, MD [address] [place],  [place] [address] [phone]

## 2023-05-03 NOTE — Progress Notes (Signed)
Met with patient to review pre-operative teaching for planned surgery scheduled for May 17, 2023. Pre-admit phone screen appointment discussed. Teaching included but not limited to labs, bowel prep and dietary restrictions, surgical location, pain scales/management, diligent peri care, guidelines to prevent constipation, and importance of early ambulation post operatively and follow-up care. Written instructions for pre-op and post-op care, contact information for questions and concerns provided to patient. Patient able to repeat instructions to nursing staff. No further questions at this time. Patient agrees with plan to proceed with scheduled procedure.

## 2023-05-03 NOTE — Patient Instructions (Signed)
Your surgery will be scheduled May 17, 2023 You will have a pre-admission telephone visit prior to surgery. This visit will take around 40-45 minutes. Gyn oncology will see you 4-6 weeks following surgery for a post operative visit.                    Laparoscopy Laparoscopy is a procedure to diagnose diseases in the abdomen. During the procedure, a thin, lighted, pencil-sized instrument called a laparoscope is inserted into the abdomen through an incision. The laparoscope allows your health care provider to look at the organs inside your body. LET [facility] CARE PROVIDER KNOW ABOUT: Any allergies you have. All medicines you are taking, including vitamins, herbs, eye drops, creams, and over-the-counter medicines. Previous problems you or members of your family have had with the use of anesthetics. Any blood disorders you have. Previous surgeries you have had. Medical conditions you have. RISKS AND COMPLICATIONS  Generally, this is a safe procedure. However, problems can occur, which may include: Infection. Bleeding. Damage to other organs. Allergic reaction to the anesthetics used during the procedure. BEFORE THE PROCEDURE Do not eat or drink anything after midnight on the night before the procedure or as directed by your health care provider. Ask your health care provider about: Changing or stopping your regular medicines. Taking medicines such as aspirin and ibuprofen. These medicines can thin your blood. Do not take these medicines before your procedure if your health care provider instructs you not to. Plan to have someone take you home after the procedure. PROCEDURE You may be given a medicine to help you relax (sedative). You will be given a medicine to make you sleep (general anesthetic). Your abdomen will be inflated with a gas. This will make your organs easier to see. Small incisions will be made in your abdomen. A laparoscope and other small instruments will be  inserted into the abdomen through the incisions. A tissue sample may be removed from an organ in the abdomen for examination. The instruments will be removed from the abdomen. The gas will be released. The incisions will be closed with stitches (sutures). AFTER THE PROCEDURE  Your blood pressure, heart rate, breathing rate, and blood oxygen level will be monitored often until the medicines you were given have worn off.   This information is not intended to replace advice given to you by your health care provider. Make sure you discuss any questions you have with your health care provider.                                             Bowel Symptoms After Surgery After gynecologic surgery, women often have temporary changes in bowel function (constipation and gas pain).  Following are tips to help prevent and treat common bowel problems.  It also tells you when to call the doctor.  This is important because some symptoms might be a sign of a more serious bowel problem such as obstruction (bowel blockage).  These problems are rare but can happen after gynecologic surgery.   Besides surgery, what can temporarily affect bowel function? 1. Dietary changes   2. Decreased physical activity   3.Antibiotics   4. Pain medication   How can I prevent constipation (three days or more without a stool)? Include fiber in your diet: whole grains, raw or dried fruits & vegetables, prunes, prune/pear juiceDrink at  least 8 glasses of liquid (preferably water) every day Avoid: Gas forming foods such as broccoli, beans, peas, salads, cabbage, sweet potatoes Greasy, fatty, or fried foods Activity helps bowel function return to normal, walk around the house at least 3-4 times each day for 15 minutes or longer, if tolerated.  Rocking in a rocking chair is preferable to sitting still. Stool softeners: these are not laxatives, but serve to soften the stool to avoid straining.  Take 2-4 times a day until normal bowel  function returns         Examples: Colace or generic equivalent (Docusate) Bulk laxatives: provide a concentrated source of fiber.  They do not stimulate the bowel.  Take 1-2 times each day until normal bowel function return.              Examples: Citrucel, Metamucil, Fiberal, Fibercon   What can I take for "Gas Pains"? Simethicone (Mylicon, Gas-X, Maalox-Gas, Mylanta-Gas) take 3-4 times a day Maalox Regular - take 3-4 times a day Mylanta Regular - take 3-4 times a day   What can I take if I become constipated? Start with stool softeners and add additional laxatives below as needed to have a bowel movement every 1-2 days  Stool softeners 1-2 tablets, 2 times a day Senokot 1-2 tablets, 1-2 times a day Glycerin suppository can soften hard stool take once a day Bisacodyl suppository once a day  Milk of Magnesia 30 mL 1-2 times a day Fleets or tap water enema    What can I do for nausea?  Limit most solid foods for 24-48 hours Continue eating small frequent amounts of liquids and/or bland soft foods Toast, crackers, cooked cereal (grits, cream of wheat, rice) Benadryl: a mild anti-nausea medicine can be obtained without a prescription. May cause drowsiness, especially if taken with narcotic pain medicines Contact provider for prescription nausea medication     What can I do, or take for diarrhea (more than five loose stools per day)? Drink plenty of clear fluids to prevent dehydration May take Kaopectate, Pepto-Bismol, Imodium, or probiotics for 1-2 days Anusol or Preparation-H can be helpful for hemorrhoids and irritated tissue around anus   When should I call the doctor?             CONSTIPATION:  Not relieved after three days following the above program VOMITING: That contains blood, "coffee ground" material More the three times/hour and unable to keep down nausea medication for more than eight hours With dry mouth, dark or strong urine, feeling light-headed, dizzy, or  confused With severe abdominal pain or bloating for more than 24 hours DIARRHEA: That continues for more then 24-48 hours despite treatment That contains blood or tarry material With dry mouth, dark or strong urine, feeling light~headed, dizzy, or confused FEVER: 101 F or higher along with nausea, vomiting, gas pain, diarrhea UNABLE TO: Pass gas from rectum for more than 24 hours Tolerate liquids by mouth for more than 24 hours        Laparoscopic Hysterectomy, Care After Refer to this sheet in the next few weeks. These instructions provide you with information on caring for yourself after your procedure. Your health care provider may also give you more specific instructions. Your treatment has been planned according to current medical practices, but problems sometimes occur. Call your health care provider if you have any problems or questions after your procedure. What can I expect after the procedure? Pain and bruising at the incision sites. You will be given  pain medicine to control it. Menopausal symptoms such as hot flashes, night sweats, and insomnia if your ovaries were removed. Sore throat from the breathing tube that was inserted during surgery. Follow these instructions at home: Only take over-the-counter or prescription medicines for pain, discomfort, or fever as directed by your health care provider. Do not take aspirin. It can cause bleeding. Do not drive when taking pain medicine. Follow your health care provider's advice regarding diet, exercise, lifting, driving, and general activities. Resume your usual diet as directed and allowed. Get plenty of rest and sleep. Do not douche, use tampons, or have sexual intercourse for at least 6 weeks, or until your health care provider gives you permission. Change your bandages (dressings) as directed by your health care provider. Monitor your temperature and notify your health care provider of a fever. Take showers instead of baths  for 2-3 weeks. Do not drink alcohol until your health care provider gives you permission. If you develop constipation, you may take a mild laxative with your health care provider's permission. Bran foods may help with constipation problems. Drinking enough fluids to keep your urine clear or pale yellow may help as well. Try to have someone home with you for 1-2 weeks to help around the house. Keep all of your follow-up appointments as directed by your health care provider. Contact a health care provider if: You have swelling, redness, or increasing pain around your incision sites. You have pus coming from your incision. You notice a bad smell coming from your incision. Your incision breaks open. You feel dizzy or lightheaded. You have pain or bleeding when you urinate. You have persistent diarrhea. You have persistent nausea and vomiting. You have abnormal vaginal discharge. You have a rash. You have any type of abnormal reaction or develop an allergy to your medicine. You have poor pain control with your prescribed medicine. Get help right away if: You have chest pain or shortness of breath. You have severe abdominal pain that is not relieved with pain medicine. You have pain or swelling in your legs. This information is not intended to replace advice given to you by your health care provider. Make sure you discuss any questions you have with your health care provider. Document Released: 03/13/2013 Document Revised: 10/29/2015 Document Reviewed: 12/11/2012 Elsevier Interactive Patient Education  2017 [employer].

## 2023-05-03 NOTE — H&P (Signed)
Gynecologic Oncology H&P    Referring Provider: Dr, Marice Potter   Chief Concern: PMB   Subjective:  Natasha Vargas is a 52 y.o. female who is seen in consultation from Dr. Marice Potter for endometrial adenocarcinoma.   Patient developed PMB and saw Dr Marice Potter 04/25/23.  Patient is a 52 y.o. married G1P0101 here as a new patient for evaluation of PMB. She went through menopause around 4 years ago. 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 She was prescribed vaginal estrogen but told her primary care provider that she still had the dyspareunia. She was prescribed estrogen patch. She used this (no progesterone) until prometrium was prescribed 10/2022. She started PMB about 2 months ago. She was given 30 days of provera.   04/25/23 ENDOMETRIUM, BIOPSY: Endometrioid adenocarcinoma, FIGO grade 2. COMMENT: Histochemical stains reveal tumor cells are positive for ER, PR and show diffuse p16 staining.  P53 is wild-type.  Controls are adequate.  This case was reviewed with Dr. Reynolds Bowl who agrees with the above diagnosis    No bleeding now.  No other complaints.    Problem List:     Patient Active Problem List    Diagnosis Date Noted   Endometrial cancer (HCC) 05/03/2023   BMI 35.0-35.9,adult 05/12/2021   Chest tightness 05/12/2021   Dyspnea on exertion 05/12/2021   Chronic pain 05/19/2020   Sciatica 05/19/2020   Severe obesity (BMI >= 40) (HCC) 04/12/2019   Anxiety and depression 04/12/2019   Severe anxiety 04/12/2019   Positive colorectal cancer screening using Cologuard test 02/13/2019   Shoulder pain, right 12/03/2018   Scoliosis 12/01/2018   Trigger finger of right thumb 12/10/2015   Vitamin D deficiency 12/07/2015   Severe obesity (BMI 35.0-35.9 with comorbidity) (HCC) 12/03/2015   Carpal tunnel syndrome on right 07/09/2015   Acquired hallux rigidus, left 12/30/2014   Acquired hallux rigidus, right 12/30/2014   Bilateral leg and foot pain 12/30/2014   MDD (major depressive  disorder) 12/23/2014   Hyperlipidemia 10/01/2014      Past Medical History:     Past Medical History:  Diagnosis Date   Anxiety     Arthritis     Depression     Migraines            Past Surgical History:      Past Surgical History:  Procedure Laterality Date   CHOLECYSTECTOMY   2004          OB History:                  OB History  Gravida Para Term Preterm AB Living  1 1   1   1   SAB IAB Ectopic Multiple Live Births                      # Outcome Date GA Lbr Len/2nd Weight Sex Type Anes PTL Lv  1 Preterm                        Family History:      Family History  Problem Relation Age of Onset   Hypertension Mother     Heart disease Mother     Diabetes Mother     Glaucoma Mother     Obesity Father     Hypertension Father            Social History: Social History         Socioeconomic History  Marital status: Married      Spouse name: Not on file   Number of children: Not on file   Years of education: Not on file   Highest education level: Not on file  Occupational History   Not on file  Tobacco Use   Smoking status: Never      Passive exposure: Never   Smokeless tobacco: Never  Substance and Sexual Activity   Alcohol use: Not on file   Drug use: Never   Sexual activity: Yes      Birth control/protection: Surgical  Other Topics Concern   Not on file  Social History Narrative   Not on file    Social Determinants of Health        Financial Resource Strain: Not on file  Food Insecurity: Not on file  Transportation Needs: Not on file  Physical Activity: Not on file  Stress: Not on file  Social Connections: Unknown (10/17/2021)    Received from [facility], [facility]    Social Network     Social Network: Not on file  Intimate Partner Violence: Unknown (09/08/2021)    Received from [facility], [facility]    HITS     Physically Hurt: Not on file     Insult or Talk Down To: Not on file     Threaten Physical Harm: Not on  file     Scream or Curse: Not on file      Allergies: Allergies       Allergies  Allergen Reactions   Other        Artifical Sweetners- Headaches   Omega-3 Other (See Comments)      Artifical Sweetners- Headaches  Artifical Sweetners- Headaches   Bee Pollen Other (See Comments)   Latex Hives and Itching      Skin peels    Other reaction(s): Unknown, Unknown  Skin peels   Skin peels   Skin peels   Skin peels   Skin peels   Skin peels   Skin peels   Skin peels    Skin peels   Skin peels   Skin peels   Skin peels    Skin peels   Skin peels    Skin peels     Skin peels  Skin peels  Skin peels  Skin peels     Skin peels  Skin peels     Other reaction(s): Unknown, Unknown Skin peels  Skin peels  Skin peels  Skin peels  Skin peels  Skin peels  Skin peels  Skin peels     Skin peels   Pollen Extract     Egg-Derived Products Other (See Comments)      headache   headache  headache   headache headache   Wheat Hives and Other (See Comments)      Other reaction(s): Unknown        Current Medications:       Current Outpatient Medications  Medication Sig Dispense Refill   b complex vitamins capsule Take 1 capsule by mouth daily.       buPROPion (WELLBUTRIN XL) 300 MG 24 hr tablet         busPIRone (BUSPAR) 15 MG tablet Take by mouth.       citalopram (CELEXA) 10 MG tablet TAKE 1 TABLET BY MOUTH EVERY DAY 90 tablet 0   estradiol (ESTRACE) 0.1 MG/GM vaginal cream Use 1 g vaginally every night for 2 weeks then two to three times per week  Ipratropium-Albuterol (COMBIVENT RESPIMAT) 20-100 MCG/ACT AERS respimat Take 2 puffs 3 x day for asthma 4 g 3   Multiple Vitamins-Minerals (MULTIVITAMIN-MINERALS PO) Take by mouth.       nortriptyline (PAMELOR) 10 MG capsule Take 40 mg by mouth at bedtime.       rizatriptan (MAXALT) 10 MG tablet take 10 mg at headache onset. Take another tablet in 2 hours if needed. Take no more than 2 tablets in a 24 hour period.        SUMAtriptan (IMITREX) 100 MG tablet Take one tablet by mouth for migraine.  Repeat in 2 hrs if not effective. Max dose of 2 doses in 24hrs.       tiZANidine (ZANAFLEX) 4 MG tablet Take by mouth.       VITAMIN D, CHOLECALCIFEROL, PO Take by mouth.       betamethasone dipropionate 0.05 % cream Apply topically.       fish oil-omega-3 fatty acids 1000 MG capsule Take by mouth.       progesterone (PROMETRIUM) 100 MG capsule Take by mouth.          No current facility-administered medications for this visit.        Review of Systems General: negative for, fevers, chills, fatigue, changes in sleep, changes in weight or appetite Skin: negative for changes in color, texture, moles or lesions Eyes: negative for, changes in pain, diplopia. Positive for vision changes (chronic) HEENT: negative for, change in hearing, pain, discharge, tinnitus, vertigo, voice changes, sore throat, neck masses Pulmonary: negative for, dyspnea, orthopnea, productive cough Cardiac: negative for, palpitations, syncope, pain, discomfort, pressure. Positive for leg pain when walking Gastrointestinal: negative for, dysphagia, nausea, vomiting, jaundice, pain, constipation, diarrhea, hematemesis, hematochezia Genitourinary/Sexual: negative for, dysuria, discharge, hesitancy, nocturia, retention, stones, infections, STD's, incontinence Musculoskeletal: negative for, pain, stiffness, swelling, range of motion limitation. Positive for back pain.  Hematology: negative for, easy bruising, bleeding Neurologic/Psych: negative for, seizures, paralysis, weakness, tremor, change in gait, change in sensation, mood swings, depression, anxiety, change in memory. Positive for headaches.    Objective:  Physical Examination:  BP (!) 137/59   Pulse 79   Temp 98.6 F (37 C)   Resp 20   Wt 214 lb 8 oz (97.3 kg)   SpO2 100%   BMI 40.53 kg/m    ECOG Performance Status: 1 - Symptomatic but completely ambulatory   General appearance:  alert, cooperative, and appears stated age HEENT:PERRLA and thyroid without masses Lymph node survey: non-palpable, axillary, inguinal, supraclavicular Cardiovascular: regular rate and rhythm, no murmurs or gallops Respiratory: normal air entry, lungs clear to auscultation and no rales, rhonchi or wheezing Abdomen: soft, non-tender, without masses or organomegaly, nondistended, no hernias, and well healed incision Back: inspection of back is normal Extremities: extremities normal, atraumatic, no cyanosis or edema Skin exam - normal coloration and turgor, no rashes, no suspicious skin lesions noted. Neurological exam reveals alert, oriented, normal speech, no focal findings or movement disorder noted.   Pelvic: exam chaperoned by nurse, EGBUS within normal limits, normal vagina and vulva;  Bimanual; normal. Cervix: anteverted; Rectal: not indicated   Lab Review Labs on site today:     Assessment:  Mailan Stencil Hammermeister is a 52 y.o. P66 female with PMB diagnosed with grade 2 endometrial adenocarcinoma on EMB.     Medical co-morbidities complicating care: Marland Kitchen  Plan:    Problem List Items Addressed This Visit              Genitourinary  Endometrial cancer (HCC) - Primary    We discussed options for management including TLH, BSO, pelvic SLN mapping and biopsies 05/17/23. Possible pelvic PA node sampling, possible laparotomy.     The risks of surgery were discussed in detail and she understands these to include infection; wound separation; hernia; vaginal cuff separation, injury to adjacent organs such as bowel, bladder, blood vessels, ureters and nerves; bleeding which may require blood transfusion; anesthesia risk; thromboembolic events; possible death; unforeseen complications; possible need for re-exploration; medical complications such as heart attack, stroke, pleural effusion and pneumonia; and, if staging performed the risk of lymphedema and lymphocyst.  The patient will receive DVT and  antibiotic prophylaxis as indicated.  She voiced a clear understanding.  She had the opportunity to ask questions and written informed consent was obtained today.    Suggested return to clinic post op.     The patient's diagnosis, an outline of the further diagnostic and laboratory studies which will be required, the recommendation, and alternatives were discussed.  All questions were answered to the patient's satisfaction.   A total of 60 minutes were spent with the patient/family today; 50% was spent in education, counseling and coordination of care for uterine cancer.     Alinda Dooms, NP   I personally interviewed and examined the patient. Agreed with the above/below plan of care. I have directly contributed to assessment and plan of care of this patient and educated and discussed with patient and family.  Leida Lauth, MD   CC:  Allie Bossier, MD [address] [place],  [place] [address] [phone]

## 2023-05-07 DIAGNOSIS — C541 Malignant neoplasm of endometrium: Secondary | ICD-10-CM

## 2023-05-07 HISTORY — DX: Malignant neoplasm of endometrium: C54.1

## 2023-05-08 ENCOUNTER — Telehealth: Payer: Self-pay

## 2023-05-08 NOTE — Telephone Encounter (Signed)
Surgery was scheduled for May 17, 2023 and sent for authorization. Authorization came back as OON. Called and notified Natasha Vargas that we need to refer her to [facility] and she reported that she was aware she was OON and that she did not want to have her surgery at [facility] due to the distance and she preferred to have  surgery at [facility]. She was contacted by the pre-service center, due to the [place] Act, she was informed that her insurance was non-participating and that she would be responsible for the total charges upfront. She asked if she could wait until January when her insurance may be in network, as it is changing. Per Dr. Johnnette Litter, this is not advised, but may take that long to get scheduled for surgery at [facility]. Surgery will be rescheduled for the first week of January. I have asked Natasha Vargas to provide her insurance information when available.

## 2023-05-09 ENCOUNTER — Encounter: Payer: BLUE CROSS/BLUE SHIELD | Admitting: Internal Medicine

## 2023-05-11 ENCOUNTER — Other Ambulatory Visit: Payer: BLUE CROSS/BLUE SHIELD

## 2023-05-11 ENCOUNTER — Other Ambulatory Visit: Payer: Self-pay | Admitting: Internal Medicine

## 2023-05-11 DIAGNOSIS — F411 Generalized anxiety disorder: Secondary | ICD-10-CM

## 2023-05-17 ENCOUNTER — Ambulatory Visit
Admission: RE | Admit: 2023-05-17 | Payer: BLUE CROSS/BLUE SHIELD | Source: Home / Self Care | Admitting: Obstetrics and Gynecology

## 2023-05-17 ENCOUNTER — Ambulatory Visit: Payer: BLUE CROSS/BLUE SHIELD | Admitting: Obstetrics

## 2023-05-17 ENCOUNTER — Encounter: Admission: RE | Payer: Self-pay | Source: Home / Self Care

## 2023-05-17 ENCOUNTER — Other Ambulatory Visit: Payer: BLUE CROSS/BLUE SHIELD

## 2023-05-17 SURGERY — HYSTERECTOMY, TOTAL, ROBOT-ASSISTED, LAPAROSCOPIC, WITH BILATERAL SALPINGO-OOPHORECTOMY
Anesthesia: General

## 2023-06-08 ENCOUNTER — Encounter
Admission: RE | Admit: 2023-06-08 | Discharge: 2023-06-08 | Disposition: A | Discharge status: Home / Self Care | Payer: BLUE CROSS/BLUE SHIELD | Source: Ambulatory Visit | Attending: Obstetrics and Gynecology | Admitting: Obstetrics and Gynecology

## 2023-06-08 ENCOUNTER — Other Ambulatory Visit: Payer: Self-pay

## 2023-06-08 VITALS — Ht 61.0 in | Wt 214.0 lb

## 2023-06-08 DIAGNOSIS — Z01812 Encounter for preprocedural laboratory examination: Secondary | ICD-10-CM

## 2023-06-08 DIAGNOSIS — Z01810 Encounter for preprocedural cardiovascular examination: Secondary | ICD-10-CM

## 2023-06-08 DIAGNOSIS — R0609 Other forms of dyspnea: Secondary | ICD-10-CM

## 2023-06-08 DIAGNOSIS — E785 Hyperlipidemia, unspecified: Secondary | ICD-10-CM

## 2023-06-08 HISTORY — DX: Other forms of dyspnea: R06.09

## 2023-06-08 HISTORY — DX: Gastro-esophageal reflux disease without esophagitis: K21.9

## 2023-06-08 HISTORY — DX: Morbid (severe) obesity due to excess calories: E66.01

## 2023-06-08 HISTORY — DX: Pneumonia, unspecified organism: J18.9

## 2023-06-08 HISTORY — DX: Exercise induced bronchospasm: J45.990

## 2023-06-08 HISTORY — DX: Vitamin D deficiency, unspecified: E55.9

## 2023-06-08 HISTORY — DX: Iron deficiency: E61.1

## 2023-06-08 HISTORY — DX: Postmenopausal atrophic vaginitis: N95.2

## 2023-06-08 HISTORY — DX: Other specified postprocedural states: Z98.890

## 2023-06-08 HISTORY — DX: Deficiency of other specified B group vitamins: E53.8

## 2023-06-08 HISTORY — DX: Hyperlipidemia, unspecified: E78.5

## 2023-06-08 NOTE — Patient Instructions (Addendum)
 Your procedure is scheduled on: Wednesday, January 8 Report to the Registration Desk on the 1st floor of the [facility] To find out your arrival time, please call [phone] between 1PM - 3PM on: Tuesday, January 7 If your arrival time is 6:00 am, do not arrive before that time as the [facility] entrance doors do not open until 6:00 am.  REMEMBER: Instructions that are not followed completely may result in serious medical risk, up to and including death; or upon the discretion of your surgeon and anesthesiologist your surgery may need to be rescheduled.  Do not eat food after midnight the night before surgery.  No gum chewing or hard candies.  You may however, drink CLEAR liquids up to 2 hours before you are scheduled to arrive for your surgery. Do not drink anything within 2 hours of your scheduled arrival time.  Clear liquids include: - water  - apple juice without pulp - gatorade (not RED colors) - black coffee or tea (Do NOT add milk or creamers to the coffee or tea) Do NOT drink anything that is not on this list.  One week prior to surgery: starting today, January 2 Stop aspirin and Anti-inflammatories (NSAIDS) such as Advil , Aleve, Ibuprofen , Motrin , Naproxen, Naprosyn and Aspirin based products such as Excedrin, Goody's Powder, BC Powder. Stop ANY OVER THE COUNTER supplements until after surgery. Stop all vitamins and minerals.  You may however, continue to take Tylenol  if needed for pain up until the day of surgery.  Continue taking all of your other prescription medications up until the day of surgery.  ON THE DAY OF SURGERY DO NOT TAKE ANY MEDICATIONS EXCEPT TO USE THE INHALER  Use COMBIVENT  inhaler on the day of surgery and bring to the hospital.  No Alcohol for 24 hours before or after surgery.  No Smoking including e-cigarettes for 24 hours before surgery.  No chewable tobacco products for at least 6 hours before surgery.  No nicotine patches on the day of  surgery.  Do not use any recreational drugs for at least a week (preferably 2 weeks) before your surgery.  Please be advised that the combination of cocaine and anesthesia may have negative outcomes, up to and including death. If you test positive for cocaine, your surgery will be cancelled.  On the morning of surgery brush your teeth with toothpaste and water, you may rinse your mouth with mouthwash if you wish. Do not swallow any toothpaste or mouthwash.  Use CHG Soap as directed on instruction sheet.  Do not wear jewelry, make-up, hairpins, clips or nail polish.  For welded (permanent) jewelry: bracelets, anklets, waist bands, etc.  Please have this removed prior to surgery.  If it is not removed, there is a chance that hospital personnel will need to cut it off on the day of surgery.  Do not wear lotions, powders, or perfumes.   Do not shave body hair from the neck down 48 hours before surgery.  Contact lenses, hearing aids and dentures may not be worn into surgery.  Do not bring valuables to the hospital. [facility] is not responsible for any missing/lost belongings or valuables.   Notify your doctor if there is any change in your medical condition (cold, fever, infection).  Wear comfortable clothing (specific to your surgery type) to the hospital.  After surgery, you can help prevent lung complications by doing breathing exercises.  Take deep breaths and cough every 1-2 hours. Your doctor may order a device called an  Incentive Spirometer to help you take deep breaths. When coughing or sneezing, hold a pillow firmly against your incision with both hands. This is called "splinting." Doing this helps protect your incision. It also decreases belly discomfort.  If you are being admitted to the hospital overnight, leave your suitcase in the car. After surgery it may be brought to your room.  In case of increased patient census, it may be necessary for you, the patient, to continue  your postoperative care in the Same Day Surgery department.  If you are being discharged the day of surgery, you will not be allowed to drive home. You will need a responsible individual to drive you home and stay with you for 24 hours after surgery.   If you are taking public transportation, you will need to have a responsible individual with you.  Please call the Pre-admissions Testing Dept. at [phone] if you have any questions about these instructions.  Surgery Visitation Policy:  Patients having surgery or a procedure may have two visitors.  Children under the age of 77 must have an adult with them who is not the patient.  Inpatient Visitation:    Visiting hours are 7 a.m. to 8 p.m. Up to four visitors are allowed at one time in a patient room. The visitors may rotate out with other people during the day.  One visitor age 8 or older may stay with the patient overnight and must be in the room by 8 p.m.     Preparing for Surgery with CHLORHEXIDINE  GLUCONATE (CHG) Soap  Chlorhexidine  Gluconate (CHG) Soap  o An antiseptic cleaner that kills germs and bonds with the skin to continue killing germs even after washing  o Used for showering the night before surgery and morning of surgery  Before surgery, you can play an important role by reducing the number of germs on your skin.  CHG (Chlorhexidine  gluconate) soap is an antiseptic cleanser which kills germs and bonds with the skin to continue killing germs even after washing.  Please do not use if you have an allergy to CHG or antibacterial soaps. If your skin becomes reddened/irritated stop using the CHG.  1. Shower the NIGHT BEFORE SURGERY and the MORNING OF SURGERY with CHG soap.  2. If you choose to wash your hair, wash your hair first as usual with your normal shampoo.  3. After shampooing, rinse your hair and body thoroughly to remove the shampoo.  4. Use CHG as you would any other liquid soap. You can apply CHG  directly to the skin and wash gently with a scrungie or a clean washcloth.  5. Apply the CHG soap to your body only from the neck down. Do not use on open wounds or open sores. Avoid contact with your eyes, ears, mouth, and genitals (private parts). Wash face and genitals (private parts) with your normal soap.  6. Wash thoroughly, paying special attention to the area where your surgery will be performed.  7. Thoroughly rinse your body with warm water.  8. Do not shower/wash with your normal soap after using and rinsing off the CHG soap.  9. Pat yourself dry with a clean towel.  10. Wear clean pajamas to bed the night before surgery.  12. Place clean sheets on your bed the night of your first shower and do not sleep with pets.  13. Shower again with the CHG soap on the day of surgery prior to arriving at the hospital.  14. Do not apply any  deodorants/lotions/powders.  15. Please wear clean clothes to the hospital.

## 2023-06-09 ENCOUNTER — Encounter
Admission: RE | Admit: 2023-06-09 | Discharge: 2023-06-09 | Disposition: A | Discharge status: Home / Self Care | Payer: BLUE CROSS/BLUE SHIELD | Source: Ambulatory Visit | Attending: Obstetrics and Gynecology | Admitting: Obstetrics and Gynecology

## 2023-06-09 DIAGNOSIS — Z01810 Encounter for preprocedural cardiovascular examination: Secondary | ICD-10-CM | POA: Diagnosis not present

## 2023-06-09 DIAGNOSIS — C541 Malignant neoplasm of endometrium: Secondary | ICD-10-CM | POA: Diagnosis not present

## 2023-06-09 DIAGNOSIS — R0609 Other forms of dyspnea: Secondary | ICD-10-CM | POA: Diagnosis not present

## 2023-06-09 DIAGNOSIS — E785 Hyperlipidemia, unspecified: Secondary | ICD-10-CM | POA: Diagnosis not present

## 2023-06-09 DIAGNOSIS — R9431 Abnormal electrocardiogram [ECG] [EKG]: Secondary | ICD-10-CM | POA: Insufficient documentation

## 2023-06-09 DIAGNOSIS — Z01818 Encounter for other preprocedural examination: Secondary | ICD-10-CM | POA: Diagnosis not present

## 2023-06-09 DIAGNOSIS — Z01812 Encounter for preprocedural laboratory examination: Secondary | ICD-10-CM

## 2023-06-09 LAB — COMPREHENSIVE METABOLIC PANEL
ALT: 23 U/L (ref 0–44)
AST: 20 U/L (ref 15–41)
Albumin: 4 g/dL (ref 3.5–5.0)
Alkaline Phosphatase: 43 U/L (ref 38–126)
Anion gap: 13 (ref 5–15)
BUN: 13 mg/dL (ref 6–20)
CO2: 22 mmol/L (ref 22–32)
Calcium: 8.6 mg/dL — ABNORMAL LOW (ref 8.9–10.3)
Chloride: 101 mmol/L (ref 98–111)
Creatinine, Ser: 0.79 mg/dL (ref 0.44–1.00)
GFR, Estimated: >60 mL/min (ref >60)
Glucose, Bld: 90 mg/dL (ref 70–99)
Potassium: 3.6 mmol/L (ref 3.5–5.1)
Sodium: 136 mmol/L (ref 135–145)
Total Bilirubin: 0.6 mg/dL (ref 0.0–1.2)
Total Protein: 7.6 g/dL (ref 6.5–8.1)

## 2023-06-09 LAB — TYPE AND SCREEN
ABO/RH(D): A POS
Antibody Screen: NEGATIVE

## 2023-06-09 LAB — CBC WITH DIFFERENTIAL/PLATELET
Abs Immature Granulocytes: 0.03 10*3/uL (ref 0.00–0.07)
Basophils Absolute: 0.1 10*3/uL (ref 0.0–0.1)
Basophils Relative: 1 %
Eosinophils Absolute: 0.2 10*3/uL (ref 0.0–0.5)
Eosinophils Relative: 3 %
HCT: 40.6 % (ref 36.0–46.0)
Hemoglobin: 13.8 g/dL (ref 12.0–15.0)
Immature Granulocytes: 0 %
Lymphocytes Relative: 43 %
Lymphs Abs: 3.5 10*3/uL (ref 0.7–4.0)
MCH: 30.7 pg (ref 26.0–34.0)
MCHC: 34 g/dL (ref 30.0–36.0)
MCV: 90.2 fL (ref 80.0–100.0)
Monocytes Absolute: 0.5 10*3/uL (ref 0.1–1.0)
Monocytes Relative: 6 %
Neutro Abs: 3.7 10*3/uL (ref 1.7–7.7)
Neutrophils Relative %: 47 %
Platelets: 321 10*3/uL (ref 150–400)
RBC: 4.5 MIL/uL (ref 3.87–5.11)
RDW: 12.6 % (ref 11.5–15.5)
WBC: 8 10*3/uL (ref 4.0–10.5)
nRBC: 0 % (ref 0.0–0.2)

## 2023-06-09 LAB — URINALYSIS, ROUTINE W REFLEX MICROSCOPIC
Bacteria, UA: NONE SEEN
Bilirubin Urine: NEGATIVE
Glucose, UA: NEGATIVE mg/dL
Hgb urine dipstick: NEGATIVE
Ketones, ur: NEGATIVE mg/dL
Nitrite: NEGATIVE
Protein, ur: NEGATIVE mg/dL
Specific Gravity, Urine: 1.014 (ref 1.005–1.030)
pH: 5 (ref 5.0–8.0)

## 2023-06-13 ENCOUNTER — Encounter: Payer: BLUE CROSS/BLUE SHIELD | Admitting: Internal Medicine

## 2023-06-14 ENCOUNTER — Ambulatory Visit: Payer: BLUE CROSS/BLUE SHIELD | Admitting: Certified Registered"

## 2023-06-14 ENCOUNTER — Ambulatory Visit
Admission: RE | Admit: 2023-06-14 | Discharge: 2023-06-14 | Disposition: A | Discharge status: Home / Self Care | Payer: BLUE CROSS/BLUE SHIELD | Attending: Obstetrics and Gynecology | Admitting: Obstetrics and Gynecology

## 2023-06-14 ENCOUNTER — Encounter
Admission: RE | Disposition: A | Discharge status: Home / Self Care | Payer: Self-pay | Source: Home / Self Care | Attending: Obstetrics and Gynecology

## 2023-06-14 ENCOUNTER — Other Ambulatory Visit: Payer: Self-pay

## 2023-06-14 ENCOUNTER — Encounter: Payer: Self-pay | Admitting: Obstetrics and Gynecology

## 2023-06-14 DIAGNOSIS — C541 Malignant neoplasm of endometrium: Secondary | ICD-10-CM | POA: Diagnosis not present

## 2023-06-14 DIAGNOSIS — E66813 Obesity, class 3: Secondary | ICD-10-CM | POA: Insufficient documentation

## 2023-06-14 DIAGNOSIS — N838 Other noninflammatory disorders of ovary, fallopian tube and broad ligament: Secondary | ICD-10-CM | POA: Insufficient documentation

## 2023-06-14 DIAGNOSIS — N8003 Adenomyosis of the uterus: Secondary | ICD-10-CM | POA: Diagnosis not present

## 2023-06-14 DIAGNOSIS — F32A Depression, unspecified: Secondary | ICD-10-CM | POA: Insufficient documentation

## 2023-06-14 DIAGNOSIS — I10 Essential (primary) hypertension: Secondary | ICD-10-CM | POA: Diagnosis not present

## 2023-06-14 DIAGNOSIS — N888 Other specified noninflammatory disorders of cervix uteri: Secondary | ICD-10-CM | POA: Diagnosis not present

## 2023-06-14 DIAGNOSIS — Z6841 Body Mass Index (BMI) 40.0 and over, adult: Secondary | ICD-10-CM | POA: Insufficient documentation

## 2023-06-14 DIAGNOSIS — N738 Other specified female pelvic inflammatory diseases: Secondary | ICD-10-CM | POA: Diagnosis not present

## 2023-06-14 DIAGNOSIS — K219 Gastro-esophageal reflux disease without esophagitis: Secondary | ICD-10-CM | POA: Insufficient documentation

## 2023-06-14 DIAGNOSIS — Z79899 Other long term (current) drug therapy: Secondary | ICD-10-CM | POA: Diagnosis not present

## 2023-06-14 DIAGNOSIS — Z90710 Acquired absence of both cervix and uterus: Secondary | ICD-10-CM

## 2023-06-14 DIAGNOSIS — F419 Anxiety disorder, unspecified: Secondary | ICD-10-CM | POA: Diagnosis not present

## 2023-06-14 DIAGNOSIS — J45990 Exercise induced bronchospasm: Secondary | ICD-10-CM | POA: Insufficient documentation

## 2023-06-14 DIAGNOSIS — Z87891 Personal history of nicotine dependence: Secondary | ICD-10-CM | POA: Diagnosis not present

## 2023-06-14 HISTORY — PX: ROBOTIC ASSISTED TOTAL HYSTERECTOMY WITH BILATERAL SALPINGO OOPHERECTOMY: SHX6086

## 2023-06-14 LAB — ABO/RH: ABO/RH(D): A POS

## 2023-06-14 SURGERY — HYSTERECTOMY, TOTAL, ROBOT-ASSISTED, LAPAROSCOPIC, WITH BILATERAL SALPINGO-OOPHORECTOMY
Anesthesia: General

## 2023-06-14 MED ORDER — SODIUM CHLORIDE 0.9 % IR SOLN
Status: DC | PRN
  Administered 2023-06-14: 1000 mL

## 2023-06-14 MED ORDER — ACETAMINOPHEN 10 MG/ML IV SOLN
INTRAVENOUS | Status: AC
  Filled 2023-06-14: qty 100

## 2023-06-14 MED ORDER — CHLORHEXIDINE GLUCONATE CLOTH 2 % EX PADS
6.0000 | MEDICATED_PAD | Freq: Once | CUTANEOUS | Status: AC
  Administered 2023-06-14: 6 via TOPICAL

## 2023-06-14 MED ORDER — DOCUSATE SODIUM 100 MG PO CAPS: 100.0000 mg | ORAL_CAPSULE | Freq: Two times a day (BID) | ORAL | 2 refills | Status: AC | PRN

## 2023-06-14 MED ORDER — LACTATED RINGERS IV SOLN: INTRAVENOUS | Status: DC

## 2023-06-14 MED ORDER — INDOCYANINE GREEN 25 MG IV SOLR
INTRAVENOUS | Status: DC | PRN
  Administered 2023-06-14: 20 mg

## 2023-06-14 MED ORDER — ONDANSETRON HCL 4 MG/2ML IJ SOLN: 4.0000 mg | Freq: Once | INTRAMUSCULAR | Status: DC | PRN

## 2023-06-14 MED ORDER — PROPOFOL 10 MG/ML IV BOLUS
INTRAVENOUS | Status: DC | PRN
  Administered 2023-06-14: 180 mg via INTRAVENOUS

## 2023-06-14 MED ORDER — LIDOCAINE HCL (CARDIAC) PF 100 MG/5ML IV SOSY
PREFILLED_SYRINGE | INTRAVENOUS | Status: DC | PRN
  Administered 2023-06-14: 80 mg via INTRAVENOUS

## 2023-06-14 MED ORDER — CHLORHEXIDINE GLUCONATE 0.12 % MT SOLN
15.0000 mL | Freq: Once | OROMUCOSAL | Status: AC
  Administered 2023-06-14: 15 mL via OROMUCOSAL

## 2023-06-14 MED ORDER — OXYCODONE HCL 5 MG PO TABS
ORAL_TABLET | ORAL | Status: AC
  Filled 2023-06-14: qty 1

## 2023-06-14 MED ORDER — IBUPROFEN 600 MG PO TABS: 600.0000 mg | ORAL_TABLET | Freq: Four times a day (QID) | ORAL | 1 refills | Status: AC | PRN

## 2023-06-14 MED ORDER — FENTANYL CITRATE (PF) 100 MCG/2ML IJ SOLN
INTRAMUSCULAR | Status: AC
  Filled 2023-06-14: qty 2

## 2023-06-14 MED ORDER — ROCURONIUM BROMIDE 10 MG/ML (PF) SYRINGE
PREFILLED_SYRINGE | INTRAVENOUS | Status: AC
  Filled 2023-06-14: qty 10

## 2023-06-14 MED ORDER — FENTANYL CITRATE (PF) 100 MCG/2ML IJ SOLN
INTRAMUSCULAR | Status: DC | PRN
  Administered 2023-06-14 (×2): 50 ug via INTRAVENOUS

## 2023-06-14 MED ORDER — FENTANYL CITRATE (PF) 100 MCG/2ML IJ SOLN
25.0000 ug | INTRAMUSCULAR | Status: DC | PRN
  Administered 2023-06-14: 50 ug via INTRAVENOUS
  Administered 2023-06-14 (×2): 25 ug via INTRAVENOUS
  Administered 2023-06-14: 50 ug via INTRAVENOUS

## 2023-06-14 MED ORDER — CEFAZOLIN SODIUM-DEXTROSE 2-4 GM/100ML-% IV SOLN
INTRAVENOUS | Status: AC
  Filled 2023-06-14: qty 100

## 2023-06-14 MED ORDER — METRONIDAZOLE 500 MG/100ML IV SOLN
500.0000 mg | INTRAVENOUS | Status: AC
Start: 2023-06-14 — End: 2023-06-14
  Administered 2023-06-14: 500 mg via INTRAVENOUS
  Filled 2023-06-14: qty 100

## 2023-06-14 MED ORDER — SIMETHICONE 80 MG PO TABS: 1.0000 | ORAL_TABLET | Freq: Four times a day (QID) | ORAL | 1 refills | Status: DC | PRN

## 2023-06-14 MED ORDER — OXYCODONE-ACETAMINOPHEN 5-325 MG PO TABS
1.0000 | ORAL_TABLET | ORAL | 0 refills | Status: DC | PRN
Start: 2023-06-14 — End: 2024-03-25

## 2023-06-14 MED ORDER — ORAL CARE MOUTH RINSE: 15.0000 mL | Freq: Once | OROMUCOSAL | Status: AC

## 2023-06-14 MED ORDER — SUGAMMADEX SODIUM 200 MG/2ML IV SOLN
INTRAVENOUS | Status: DC | PRN
  Administered 2023-06-14 (×2): 200 mg via INTRAVENOUS

## 2023-06-14 MED ORDER — CEFAZOLIN SODIUM-DEXTROSE 2-4 GM/100ML-% IV SOLN
2.0000 g | INTRAVENOUS | Status: AC
  Administered 2023-06-14: 2 g via INTRAVENOUS

## 2023-06-14 MED ORDER — PHENYLEPHRINE 80 MCG/ML (10ML) SYRINGE FOR IV PUSH (FOR BLOOD PRESSURE SUPPORT)
PREFILLED_SYRINGE | INTRAVENOUS | Status: DC | PRN
  Administered 2023-06-14 (×2): 160 ug via INTRAVENOUS

## 2023-06-14 MED ORDER — ROCURONIUM BROMIDE 100 MG/10ML IV SOLN
INTRAVENOUS | Status: DC | PRN
  Administered 2023-06-14: 70 mg via INTRAVENOUS
  Administered 2023-06-14: 10 mg via INTRAVENOUS
  Administered 2023-06-14: 20 mg via INTRAVENOUS

## 2023-06-14 MED ORDER — CHLORHEXIDINE GLUCONATE 0.12 % MT SOLN
OROMUCOSAL | Status: AC
  Filled 2023-06-14: qty 15

## 2023-06-14 MED ORDER — BUPIVACAINE HCL (PF) 0.5 % IJ SOLN
INTRAMUSCULAR | Status: AC
  Filled 2023-06-14: qty 30

## 2023-06-14 MED ORDER — BUPIVACAINE HCL (PF) 0.5 % IJ SOLN
INTRAMUSCULAR | Status: DC | PRN
  Administered 2023-06-14: 20 mL

## 2023-06-14 MED ORDER — ONDANSETRON HCL 4 MG/2ML IJ SOLN
INTRAMUSCULAR | Status: DC | PRN
  Administered 2023-06-14: 4 mg via INTRAVENOUS

## 2023-06-14 MED ORDER — ACETAMINOPHEN 10 MG/ML IV SOLN
INTRAVENOUS | Status: DC | PRN
  Administered 2023-06-14: 1000 mg via INTRAVENOUS

## 2023-06-14 MED ORDER — KETOROLAC TROMETHAMINE 30 MG/ML IJ SOLN
INTRAMUSCULAR | Status: DC | PRN
  Administered 2023-06-14: 30 mg via INTRAVENOUS

## 2023-06-14 MED ORDER — OXYCODONE HCL 5 MG/5ML PO SOLN: 5.0000 mg | Freq: Once | ORAL | Status: AC | PRN

## 2023-06-14 MED ORDER — DEXAMETHASONE SODIUM PHOSPHATE 10 MG/ML IJ SOLN
INTRAMUSCULAR | Status: DC | PRN
  Administered 2023-06-14: 5 mg via INTRAVENOUS

## 2023-06-14 MED ORDER — MIDAZOLAM HCL 2 MG/2ML IJ SOLN
INTRAMUSCULAR | Status: AC
Start: 2023-06-14 — End: ?
  Filled 2023-06-14: qty 2

## 2023-06-14 MED ORDER — CHLORHEXIDINE GLUCONATE CLOTH 2 % EX PADS
6.0000 | MEDICATED_PAD | Freq: Once | CUTANEOUS | Status: AC
Start: 2023-06-14 — End: 2023-06-14
  Administered 2023-06-14: 6 via TOPICAL

## 2023-06-14 MED ORDER — HEPARIN SODIUM (PORCINE) 5000 UNIT/ML IJ SOLN
INTRAMUSCULAR | Status: AC
  Filled 2023-06-14: qty 1

## 2023-06-14 MED ORDER — 0.9 % SODIUM CHLORIDE (POUR BTL) OPTIME
TOPICAL | Status: DC | PRN
  Administered 2023-06-14: 1000 mL

## 2023-06-14 MED ORDER — MIDAZOLAM HCL 2 MG/2ML IJ SOLN
INTRAMUSCULAR | Status: DC | PRN
  Administered 2023-06-14: 2 mg via INTRAVENOUS

## 2023-06-14 MED ORDER — HEPARIN SODIUM (PORCINE) 5000 UNIT/ML IJ SOLN
INTRAMUSCULAR | Status: DC | PRN
  Administered 2023-06-14: 5000 [IU] via SUBCUTANEOUS

## 2023-06-14 MED ORDER — OXYCODONE HCL 5 MG PO TABS
5.0000 mg | ORAL_TABLET | Freq: Once | ORAL | Status: AC | PRN
  Administered 2023-06-14: 5 mg via ORAL

## 2023-06-14 MED ORDER — PROPOFOL 10 MG/ML IV BOLUS
INTRAVENOUS | Status: AC
  Filled 2023-06-14: qty 40

## 2023-06-14 SURGICAL SUPPLY — 84 items
ANCHOR TIS RET SYS 235ML (MISCELLANEOUS) IMPLANT
BAG LAPAROSCOPIC 12 15 PORT 16 (BASKET) IMPLANT
BAG RETRIEVAL 12/15 (BASKET) IMPLANT
BAG URINE DRAIN 2000ML AR STRL (UROLOGICAL SUPPLIES) ×1 IMPLANT
BLADE SURG SZ11 CARB STEEL (BLADE) ×1 IMPLANT
CANNULA CAP OBTURATR AIRSEAL 8 (CAP) ×1 IMPLANT
CATH URTH 16FR FL 2W BLN LF (CATHETERS) ×1 IMPLANT
CAUTERY HOOK MNPLR 1.6 DVNC XI (INSTRUMENTS) ×1 IMPLANT
CNTNR URN SCR LID CUP LEK RST (MISCELLANEOUS) IMPLANT
COUNTER NEEDLE 20/40 LG (NEEDLE) IMPLANT
COVER TIP SHEARS 8 DVNC (MISCELLANEOUS) ×1 IMPLANT
COVER WAND RF STERILE (DRAPES) ×1 IMPLANT
DERMABOND ADVANCED .7 DNX12 (GAUZE/BANDAGES/DRESSINGS) ×1 IMPLANT
DRAPE ARM DVNC X/XI (DISPOSABLE) ×3 IMPLANT
DRAPE COLUMN DVNC XI (DISPOSABLE) ×1 IMPLANT
DRAPE SHEET LG 3/4 BI-LAMINATE (DRAPES) ×1 IMPLANT
DRESSING SURGICEL FIBRLLR 1X2 (HEMOSTASIS) IMPLANT
DRIVER NDL MEGA SUTCUT DVNCXI (INSTRUMENTS) ×1 IMPLANT
DRIVER NDLE MEGA SUTCUT DVNCXI (INSTRUMENTS) ×1 IMPLANT
DRSG SURGICEL FIBRILLAR 1X2 (HEMOSTASIS) IMPLANT
DRSG TELFA 3X4 N-ADH STERILE (GAUZE/BANDAGES/DRESSINGS) IMPLANT
ELECT BLADE 6 FLAT ULTRCLN (ELECTRODE) IMPLANT
ELECT CAUTERY BLADE TIP 2.5 (TIP) ×1 IMPLANT
ELECT REM PT RETURN 9FT ADLT (ELECTROSURGICAL) ×1 IMPLANT
ELECTRODE CAUTERY BLDE TIP 2.5 (TIP) ×1 IMPLANT
ELECTRODE REM PT RTRN 9FT ADLT (ELECTROSURGICAL) ×1 IMPLANT
FORCEPS BPLR 8 MD DVNC XI (FORCEP) ×1 IMPLANT
FORCEPS BPLR FENES DVNC XI (FORCEP) ×1 IMPLANT
GAUZE 4X4 16PLY ~~LOC~~+RFID DBL (SPONGE) ×1 IMPLANT
GLOVE BIO SURGEON STRL SZ 6.5 (GLOVE) ×6 IMPLANT
GLOVE BIOGEL PI IND STRL 6.5 (GLOVE) ×4 IMPLANT
GLOVE BIOGEL PI IND STRL 7.0 (GLOVE) ×1 IMPLANT
GOWN STRL REUS W/ TWL LRG LVL3 (GOWN DISPOSABLE) ×8 IMPLANT
HEMOSTAT SURGICEL 2X3 (HEMOSTASIS) IMPLANT
IRRIGATION STRYKERFLOW (MISCELLANEOUS) IMPLANT
IRRIGATOR STRYKERFLOW (MISCELLANEOUS) ×1 IMPLANT
IV NS 1000ML BAXH (IV SOLUTION) IMPLANT
KIT IMAGING PINPOINTPAQ (MISCELLANEOUS) IMPLANT
KIT PINK PAD W/HEAD ARE REST (MISCELLANEOUS) ×1 IMPLANT
KIT PINK PAD W/HEAD ARM REST (MISCELLANEOUS) ×1 IMPLANT
LABEL OR SOLS (LABEL) ×1 IMPLANT
MANIFOLD NEPTUNE II (INSTRUMENTS) ×1 IMPLANT
MANIPULATOR VCARE LG CRV RETR (MISCELLANEOUS) IMPLANT
MANIPULATOR VCARE SML CRV RETR (MISCELLANEOUS) IMPLANT
MANIPULATOR VCARE STD CRV RETR (MISCELLANEOUS) IMPLANT
NDL HYPO 22X1.5 SAFETY MO (MISCELLANEOUS) ×1 IMPLANT
NDL INSUFFLATION 14GA 120MM (NEEDLE) ×1 IMPLANT
NDL SPNL 22GX3.5 QUINCKE BK (NEEDLE) IMPLANT
NEEDLE HYPO 22X1.5 SAFETY MO (MISCELLANEOUS) ×1 IMPLANT
NEEDLE INSUFFLATION 14GA 120MM (NEEDLE) ×1 IMPLANT
NEEDLE SPNL 22GX3.5 QUINCKE BK (NEEDLE) ×1 IMPLANT
NS IRRIG 1000ML POUR BTL (IV SOLUTION) ×1 IMPLANT
OBTURATOR OPTICAL STND 8 DVNC (TROCAR) ×1 IMPLANT
OBTURATOR OPTICALSTD 8 DVNC (TROCAR) IMPLANT
OCCLUDER COLPOPNEUMO (BALLOONS) ×1 IMPLANT
PACK GYN LAPAROSCOPIC (MISCELLANEOUS) ×1 IMPLANT
PAD PREP OB/GYN DISP 24X41 (PERSONAL CARE ITEMS) ×1 IMPLANT
PENCIL SMOKE EVACUATOR (MISCELLANEOUS) ×1 IMPLANT
SCISSORS MNPLR CVD DVNC XI (INSTRUMENTS) ×1 IMPLANT
SCRUB CHG 4% DYNA-HEX 4OZ (MISCELLANEOUS) ×1 IMPLANT
SEAL UNIV 5-12 XI (MISCELLANEOUS) ×3 IMPLANT
SEALER VESSEL EXT DVNC XI (MISCELLANEOUS) IMPLANT
SET CYSTO W/LG BORE CLAMP LF (SET/KITS/TRAYS/PACK) IMPLANT
SET TUBE FILTERED XL AIRSEAL (SET/KITS/TRAYS/PACK) ×1 IMPLANT
SLEEVE Z-THREAD 5X100MM (TROCAR) IMPLANT
SOL ELECTROSURG ANTI STICK (MISCELLANEOUS) ×1 IMPLANT
SOLUTION ELECTROSURG ANTI STCK (MISCELLANEOUS) ×1 IMPLANT
SPONGE T-LAP 18X18 ~~LOC~~+RFID (SPONGE) IMPLANT
SUT MNCRL 4-0 27 PS-2 XMFL (SUTURE) ×1 IMPLANT
SUT MNCRL 4-0 27XMFL (SUTURE) ×1
SUT STRATAFIX SPIRAL PDS+ 0 30 (SUTURE) IMPLANT
SUT VIC AB 0 CT1 36 (SUTURE) ×1 IMPLANT
SUT VICRYL 0 UR6 27IN ABS (SUTURE) ×1 IMPLANT
SUTURE MNCRL 4-0 27XMF (SUTURE) ×1 IMPLANT
SYR 10ML LL (SYRINGE) ×1 IMPLANT
SYR 3ML LL SCALE MARK (SYRINGE) IMPLANT
SYR 50ML LL SCALE MARK (SYRINGE) ×1 IMPLANT
SYR TOOMEY 50ML (SYRINGE) IMPLANT
TAPE TRANSPORE STRL 2 31045 (GAUZE/BANDAGES/DRESSINGS) ×1 IMPLANT
TRAP FLUID SMOKE EVACUATOR (MISCELLANEOUS) ×1 IMPLANT
TRAP SPECIMEN MUCUS 40CC (MISCELLANEOUS) IMPLANT
TROCAR XCEL NON-BLD 11X100MML (ENDOMECHANICALS) IMPLANT
TROCAR Z-THREAD FIOS 5X100MM (TROCAR) IMPLANT
WATER STERILE IRR 500ML POUR (IV SOLUTION) ×1 IMPLANT

## 2023-06-14 NOTE — Transfer of Care (Signed)
 Immediate Anesthesia Transfer of Care Note  Patient: Natasha Vargas  Procedure(s) Performed: XI ROBOTIC ASSISTED TOTAL HYSTERECTOMY WITH BILATERAL SALPINGO OOPHORECTOMY, PELVIC SENTINEL LYMPH NODE MAPPING AND BIOPSIES, POSSIBLE PELVIC/AORTIC NODE SAMPLING, LAPAROTOMY  Patient Location: PACU  Anesthesia Type:General  Level of Consciousness: awake and sedated  Airway & Oxygen Therapy: Patient Spontanous Breathing and Patient connected to face mask oxygen  Post-op Assessment: Report given to RN and Post -op Vital signs reviewed and stable  Post vital signs: Reviewed and stable  Last Vitals:  Vitals Value Taken Time  BP 110/55 06/14/23 1045  Temp 36.6 C 06/14/23 1045  Pulse 86 06/14/23 1048  Resp 14 06/14/23 1048  SpO2 100 % 06/14/23 1048  Vitals shown include unfiled device data.  Last Pain:  Vitals:   06/14/23 0629  TempSrc: Temporal  PainSc: 0-No pain         Complications: No notable events documented.

## 2023-06-14 NOTE — Anesthesia Procedure Notes (Signed)
 Procedure Name: Intubation Date/Time: 06/14/2023 7:45 AM  Performed by: Boone Fess, MDPre-anesthesia Checklist: Patient identified, Patient being monitored, Timeout performed, Emergency Drugs available and Suction available Patient Re-evaluated:Patient Re-evaluated prior to induction Oxygen Delivery Method: Circle system utilized Preoxygenation: Pre-oxygenation with 100% oxygen Induction Type: IV induction Ventilation: Mask ventilation without difficulty Laryngoscope Size: 3 and McGrath Grade View: Grade I Tube type: Oral Tube size: 6.5 mm Number of attempts: 1 Airway Equipment and Method: Stylet Placement Confirmation: ETT inserted through vocal cords under direct vision, positive ETCO2 and breath sounds checked- equal and bilateral Secured at: 21 cm Tube secured with: Tape Dental Injury: Teeth and Oropharynx as per pre-operative assessment

## 2023-06-14 NOTE — Anesthesia Postprocedure Evaluation (Signed)
 Anesthesia Post Note  Patient: Natasha Vargas  Procedure(s) Performed: XI ROBOTIC ASSISTED TOTAL HYSTERECTOMY WITH BILATERAL SALPINGO OOPHORECTOMY, PELVIC SENTINEL LYMPH NODE MAPPING AND BIOPSIES, POSSIBLE PELVIC/AORTIC NODE SAMPLING, LAPAROTOMY  Patient location during evaluation: PACU Anesthesia Type: General Level of consciousness: awake and alert Pain management: pain level controlled Vital Signs Assessment: post-procedure vital signs reviewed and stable Respiratory status: spontaneous breathing, nonlabored ventilation, respiratory function stable and patient connected to nasal cannula oxygen Cardiovascular status: blood pressure returned to baseline and stable Postop Assessment: no apparent nausea or vomiting Anesthetic complications: no   No notable events documented.   Last Vitals:  Vitals:   06/14/23 1112 06/14/23 1115  BP:  107/60  Pulse: 82 81  Resp: 13 (!) 0  Temp:    SpO2: 95% 92%    Last Pain:  Vitals:   06/14/23 1112  TempSrc:   PainSc: 5                  [name]

## 2023-06-14 NOTE — Anesthesia Preprocedure Evaluation (Signed)
 Anesthesia Evaluation  Patient identified by MRN, date of birth, ID band Patient awake  General Assessment Comment:Does not get motion sick easily. PONV once 20 years ago  Reviewed: Allergy & Precautions, NPO status , Patient's Chart, lab work & pertinent test results  History of Anesthesia Complications (+) PONV and history of anesthetic complications  Airway Mallampati: II  TM Distance: >3 FB Neck ROM: Full    Dental no notable dental hx. (+) Teeth Intact   Pulmonary asthma , neg sleep apnea, neg COPD, Patient abstained from smoking.Not current smoker, former smoker Well controlled exercise induced asthma   Pulmonary exam normal breath sounds clear to auscultation       Cardiovascular Exercise Tolerance: Good METShypertension, Pt. on medications (-) CAD and (-) Past MI (-) dysrhythmias  Rhythm:Regular Rate:Normal - Systolic murmurs    Neuro/Psych  Headaches PSYCHIATRIC DISORDERS Anxiety Depression       GI/Hepatic ,GERD  Controlled,,(+)     (-) substance abuse    Endo/Other  neg diabetes  Class 3 obesity  Renal/GU negative Renal ROS     Musculoskeletal   Abdominal  (+) + obese  Peds  Hematology   Anesthesia Other Findings Past Medical History: No date: Anxiety No date: Arthritis No date: Atrophic vaginitis 2014: Carpal tunnel syndrome on right No date: Depression No date: Dyspnea on exertion 05/2023: Endometrial adenocarcinoma (HCC) No date: Exercise-induced asthma No date: GERD (gastroesophageal reflux disease) No date: Hyperlipidemia No date: Iron deficiency No date: Migraines No date: Pneumonia No date: PONV (postoperative nausea and vomiting) 1990: Preeclampsia     Comment:  seizure No date: Severe obesity (BMI >= 40) (HCC) No date: Vitamin B12 deficiency No date: Vitamin D  deficiency  Reproductive/Obstetrics                             Anesthesia Physical Anesthesia  Plan  ASA: 3  Anesthesia Plan: General   Post-op Pain Management: Ofirmev  IV (intra-op)* and Toradol  IV (intra-op)*   Induction: Intravenous  PONV Risk Score and Plan: 4 or greater and Ondansetron , Dexamethasone  and Midazolam   Airway Management Planned: Oral ETT and Video Laryngoscope Planned  Additional Equipment: None  Intra-op Plan:   Post-operative Plan: Extubation in OR  Informed Consent: I have reviewed the patients History and Physical, chart, labs and discussed the procedure including the risks, benefits and alternatives for the proposed anesthesia with the patient or authorized representative who has indicated his/her understanding and acceptance.     Dental advisory given  Plan Discussed with: CRNA and Surgeon  Anesthesia Plan Comments: (Discussed risks of anesthesia with patient, including PONV, sore throat, lip/dental/eye damage. Rare risks discussed as well, such as cardiorespiratory and neurological sequelae, and allergic reactions. Discussed possible blood transfusion.Discussed the role of CRNA in patient's perioperative care. Patient understands.)       Anesthesia Quick Evaluation

## 2023-06-14 NOTE — H&P (Signed)
 Gynecologic Oncology H&P    Referring Provider: Dr, Marice Potter   Chief Concern: PMB   Subjective:  Natasha Vargas is a 53 y.o. female who is seen in consultation from Dr. Marice Potter for endometrial adenocarcinoma.   Patient developed PMB and saw Dr Marice Potter 04/25/23.  Patient is a 53 y.o. married G1P0101 here as a new patient for evaluation of PMB. She went through menopause around 4 years ago. 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 She was prescribed vaginal estrogen but told her primary care provider that she still had the dyspareunia. She was prescribed estrogen patch. She used this (no progesterone) until prometrium was prescribed 10/2022. She started PMB about 2 months ago. She was given 30 days of provera.   04/25/23 ENDOMETRIUM, BIOPSY: Endometrioid adenocarcinoma, FIGO grade 2. COMMENT: Histochemical stains reveal tumor cells are positive for ER, PR and show diffuse p16 staining.  P53 is wild-type.  Controls are adequate.  This case was reviewed with Dr. Reynolds Bowl who agrees with the above diagnosis    No bleeding now.  No other complaints.    Problem List:     Patient Active Problem List    Diagnosis Date Noted   Endometrial cancer (HCC) 05/03/2023   BMI 35.0-35.9,adult 05/12/2021   Chest tightness 05/12/2021   Dyspnea on exertion 05/12/2021   Chronic pain 05/19/2020   Sciatica 05/19/2020   Severe obesity (BMI >= 40) (HCC) 04/12/2019   Anxiety and depression 04/12/2019   Severe anxiety 04/12/2019   Positive colorectal cancer screening using Cologuard test 02/13/2019   Shoulder pain, right 12/03/2018   Scoliosis 12/01/2018   Trigger finger of right thumb 12/10/2015   Vitamin D deficiency 12/07/2015   Severe obesity (BMI 35.0-35.9 with comorbidity) (HCC) 12/03/2015   Carpal tunnel syndrome on right 07/09/2015   Acquired hallux rigidus, left 12/30/2014   Acquired hallux rigidus, right 12/30/2014   Bilateral leg and foot pain 12/30/2014   MDD (major depressive  disorder) 12/23/2014   Hyperlipidemia 10/01/2014      Past Medical History:     Past Medical History:  Diagnosis Date   Anxiety     Arthritis     Depression     Migraines            Past Surgical History:      Past Surgical History:  Procedure Laterality Date   CHOLECYSTECTOMY   2004          OB History:                  OB History  Gravida Para Term Preterm AB Living  1 1   1   1   SAB IAB Ectopic Multiple Live Births                      # Outcome Date GA Lbr Len/2nd Weight Sex Type Anes PTL Lv  1 Preterm                        Family History:      Family History  Problem Relation Age of Onset   Hypertension Mother     Heart disease Mother     Diabetes Mother     Glaucoma Mother     Obesity Father     Hypertension Father            Social History: Social History         Socioeconomic History  Marital status: Married      Spouse name: Not on file   Number of children: Not on file   Years of education: Not on file   Highest education level: Not on file  Occupational History   Not on file  Tobacco Use   Smoking status: Never      Passive exposure: Never   Smokeless tobacco: Never  Substance and Sexual Activity   Alcohol use: Not on file   Drug use: Never   Sexual activity: Yes      Birth control/protection: Surgical  Other Topics Concern   Not on file  Social History Narrative   Not on file    Social Determinants of Health        Financial Resource Strain: Not on file  Food Insecurity: Not on file  Transportation Needs: Not on file  Physical Activity: Not on file  Stress: Not on file  Social Connections: Unknown (10/17/2021)    Received from [facility], [facility]    Social Network     Social Network: Not on file  Intimate Partner Violence: Unknown (09/08/2021)    Received from [facility], [facility]    HITS     Physically Hurt: Not on file     Insult or Talk Down To: Not on file     Threaten Physical Harm: Not on  file     Scream or Curse: Not on file      Allergies: Allergies       Allergies  Allergen Reactions   Other        Artifical Sweetners- Headaches   Omega-3 Other (See Comments)      Artifical Sweetners- Headaches  Artifical Sweetners- Headaches   Bee Pollen Other (See Comments)   Latex Hives and Itching      Skin peels    Other reaction(s): Unknown, Unknown  Skin peels   Skin peels   Skin peels   Skin peels   Skin peels   Skin peels   Skin peels   Skin peels    Skin peels   Skin peels   Skin peels   Skin peels    Skin peels   Skin peels    Skin peels     Skin peels  Skin peels  Skin peels  Skin peels     Skin peels  Skin peels     Other reaction(s): Unknown, Unknown Skin peels  Skin peels  Skin peels  Skin peels  Skin peels  Skin peels  Skin peels  Skin peels     Skin peels   Pollen Extract     Egg-Derived Products Other (See Comments)      headache   headache  headache   headache headache   Wheat Hives and Other (See Comments)      Other reaction(s): Unknown        Current Medications:       Current Outpatient Medications  Medication Sig Dispense Refill   b complex vitamins capsule Take 1 capsule by mouth daily.       buPROPion (WELLBUTRIN XL) 300 MG 24 hr tablet         busPIRone (BUSPAR) 15 MG tablet Take by mouth.       citalopram (CELEXA) 10 MG tablet TAKE 1 TABLET BY MOUTH EVERY DAY 90 tablet 0   estradiol (ESTRACE) 0.1 MG/GM vaginal cream Use 1 g vaginally every night for 2 weeks then two to three times per week  Ipratropium-Albuterol (COMBIVENT RESPIMAT) 20-100 MCG/ACT AERS respimat Take 2 puffs 3 x day for asthma 4 g 3   Multiple Vitamins-Minerals (MULTIVITAMIN-MINERALS PO) Take by mouth.       nortriptyline (PAMELOR) 10 MG capsule Take 40 mg by mouth at bedtime.       rizatriptan (MAXALT) 10 MG tablet take 10 mg at headache onset. Take another tablet in 2 hours if needed. Take no more than 2 tablets in a 24 hour period.        SUMAtriptan (IMITREX) 100 MG tablet Take one tablet by mouth for migraine.  Repeat in 2 hrs if not effective. Max dose of 2 doses in 24hrs.       tiZANidine (ZANAFLEX) 4 MG tablet Take by mouth.       VITAMIN D, CHOLECALCIFEROL, PO Take by mouth.       betamethasone dipropionate 0.05 % cream Apply topically.       fish oil-omega-3 fatty acids 1000 MG capsule Take by mouth.       progesterone (PROMETRIUM) 100 MG capsule Take by mouth.          No current facility-administered medications for this visit.        Review of Systems General: negative for, fevers, chills, fatigue, changes in sleep, changes in weight or appetite Skin: negative for changes in color, texture, moles or lesions Eyes: negative for, changes in pain, diplopia. Positive for vision changes (chronic) HEENT: negative for, change in hearing, pain, discharge, tinnitus, vertigo, voice changes, sore throat, neck masses Pulmonary: negative for, dyspnea, orthopnea, productive cough Cardiac: negative for, palpitations, syncope, pain, discomfort, pressure. Positive for leg pain when walking Gastrointestinal: negative for, dysphagia, nausea, vomiting, jaundice, pain, constipation, diarrhea, hematemesis, hematochezia Genitourinary/Sexual: negative for, dysuria, discharge, hesitancy, nocturia, retention, stones, infections, STD's, incontinence Musculoskeletal: negative for, pain, stiffness, swelling, range of motion limitation. Positive for back pain.  Hematology: negative for, easy bruising, bleeding Neurologic/Psych: negative for, seizures, paralysis, weakness, tremor, change in gait, change in sensation, mood swings, depression, anxiety, change in memory. Positive for headaches.    Objective:  Physical Examination:  BP (!) 137/59   Pulse 79   Temp 98.6 F (37 C)   Resp 20   Wt 214 lb 8 oz (97.3 kg)   SpO2 100%   BMI 40.53 kg/m    ECOG Performance Status: 1 - Symptomatic but completely ambulatory   General appearance:  alert, cooperative, and appears stated age HEENT:PERRLA and thyroid without masses Lymph node survey: non-palpable, axillary, inguinal, supraclavicular Cardiovascular: regular rate and rhythm, no murmurs or gallops Respiratory: normal air entry, lungs clear to auscultation and no rales, rhonchi or wheezing Abdomen: soft, non-tender, without masses or organomegaly, nondistended, no hernias, and well healed incision Back: inspection of back is normal Extremities: extremities normal, atraumatic, no cyanosis or edema Skin exam - normal coloration and turgor, no rashes, no suspicious skin lesions noted. Neurological exam reveals alert, oriented, normal speech, no focal findings or movement disorder noted.   Pelvic: exam chaperoned by nurse, EGBUS within normal limits, normal vagina and vulva;  Bimanual; normal. Cervix: anteverted; Rectal: not indicated   Lab Review Labs on site today:     Assessment:  Natasha Vargas is a 53 y.o. P66 female with PMB diagnosed with grade 2 endometrial adenocarcinoma on EMB.     Medical co-morbidities complicating care: Marland Kitchen  Plan:    Problem List Items Addressed This Visit              Genitourinary  Endometrial cancer (HCC) - Primary    We discussed options for management including TLH, BSO, pelvic SLN mapping and biopsies 05/17/23. Possible pelvic PA node sampling, possible laparotomy.     The risks of surgery were discussed in detail and she understands these to include infection; wound separation; hernia; vaginal cuff separation, injury to adjacent organs such as bowel, bladder, blood vessels, ureters and nerves; bleeding which may require blood transfusion; anesthesia risk; thromboembolic events; possible death; unforeseen complications; possible need for re-exploration; medical complications such as heart attack, stroke, pleural effusion and pneumonia; and, if staging performed the risk of lymphedema and lymphocyst.  The patient will receive DVT and  antibiotic prophylaxis as indicated.  She voiced a clear understanding.  She had the opportunity to ask questions and written informed consent was obtained today.    Suggested return to clinic post op.     The patient's diagnosis, an outline of the further diagnostic and laboratory studies which will be required, the recommendation, and alternatives were discussed.  All questions were answered to the patient's satisfaction.   A total of 60 minutes were spent with the patient/family today; 50% was spent in education, counseling and coordination of care for uterine cancer.     Alinda Dooms, NP   I personally interviewed and examined the patient. Agreed with the above/below plan of care. I have directly contributed to assessment and plan of care of this patient and educated and discussed with patient and family.  Leida Lauth, MD   CC:  Allie Bossier, MD [address] [place],  [place] [address] [phone]

## 2023-06-14 NOTE — Interval H&P Note (Signed)
 History and Physical Interval Note:  06/14/2023 7:18 AM  [name]  has presented today for surgery, with the diagnosis of Endometrial cancer.  The various methods of treatment have been discussed with the patient and family. After consideration of risks, benefits and other options for treatment, the patient has consented to  Procedure(s): XI ROBOTIC ASSISTED TOTAL HYSTERECTOMY WITH BILATERAL SALPINGO OOPHORECTOMY, PELVIC SENTINEL LYMPH NODE MAPPING AND BIOPSIES, POSSIBLE PELVIC/AORTIC NODE SAMPLING, LAPAROTOMY (N/A) as a surgical intervention.  The patient's history has been reviewed, patient examined, no change in status, stable for surgery.  I have reviewed the patient's chart and labs.  Questions were answered to the patient's satisfaction.     Marria Mathison [employer]

## 2023-06-14 NOTE — Anesthesia Procedure Notes (Signed)
 Procedure Name: Intubation Date/Time: 06/14/2023 7:39 AM  Performed by: Jarvis Lew, CRNAPre-anesthesia Checklist: Patient identified, Patient being monitored, Timeout performed, Emergency Drugs available and Suction available Patient Re-evaluated:Patient Re-evaluated prior to induction Oxygen Delivery Method: Circle system utilized Preoxygenation: Pre-oxygenation with 100% oxygen Induction Type: IV induction Ventilation: Mask ventilation without difficulty Laryngoscope Size: Glidescope and 4 Grade View: Grade I Tube type: Oral Tube size: 7.0 mm Number of attempts: 1 Airway Equipment and Method: Stylet Placement Confirmation: ETT inserted through vocal cords under direct vision, positive ETCO2 and breath sounds checked- equal and bilateral Secured at: 20 cm Tube secured with: Tape Dental Injury: Teeth and Oropharynx as per pre-operative assessment  Comments: Easy mask aw.  Eyes taped closed prior to DL.  ETT tape reenforced.  Donut round foam pillow placed over face once OGT/BIS/nasal temp placed. All wires/lines are padded with gauze.

## 2023-06-14 NOTE — Op Note (Signed)
 Operative Note   Date 06/14/2023 TIME 10:12 AM  PRE-OP DIAGNOSIS: Grade 2 endometrial cancer  POST-OP DIAGNOSIS: Grade 2 endometrial cancer, stage I pending final pathology  SURGEON: Surgeon(s) and Role: Panel 1: Archie Savers, MD Margarett Viti Isidor Constable, MD  ANESTHESIA: General  PROCEDURE: Procedure(s): Exam under anesthesia, Robotic assisted total hysterectomy and bilateral salpingo-oophorectomy with sentinel node injection, mapping, and biopsies.  ESTIMATED BLOOD LOSS: 50 cc  DRAINS: Foley  TOTAL IV FLUIDS: 450 cc  UOP: see anesthesia note  SPECIMENS:  Uterus, and bilateral fallopian tubes and ovaries, bilateral  sentinel nodes (right obturator; left external iliac artery and vein), and washings.   COMPLICATIONS: None  DISPOSITION: PACU  CONDITION: Stable  INDICATIONS: Grade 2 endometrial cancer  FINDINGS: Exam under anesthesia revealed an normal week mobile anteverted uterus. There were no adnexal masses or nodularity. The parametria was smooth. The cervix was negative for gross lesions. Intraoperative findings included: The uterus was normal size and shape 6-8 weeks size. The adnexa were normal bilaterally except for small 1 cm cystic lesion off the left tube. The upper abdomen was normal including omentum, bowel, liver, stomach, and diaphragmatic surfaces. There was no evidence of grossly enlarged pelvic or right para-aortic lymph nodes. The sentinel nodes mapped to the right obturator and left external iliac artery and vein nodes.   PROCEDURE IN DETAIL: After informed consent was obtained, the patient was taken to the operating room where anesthesia was obtained without difficulty. The patient was positioned in the dorsal lithotomy position in Iraan stirrups and her arms were carefully tucked at her sides and the usual precautions were taken.  She was prepped and draped in normal sterile fashion.  Time-out was performed.  A speculum was placed in the vagina and the  cervical os was dilated. Identifying the cervix was challenging due to a rectocele. The uterus sounded to 7-8 cm.  ICG was injected with 2 cm bilaterally. Two injection were performed on each side due to limited visibility.  A small V-Care uterine manipulator was then placed in the uterus without incident and under direct visualization.    Simultaneously, Dr. Savers performed that pperative entry was obtained via a supraumbilical incision and Optiview. The abdomen insuffulated, and pelvis visualized with noted findings above.  The patient was placed in Trendelenburg and the bowel was displaced up into the upper abdomen.  The robotic ports and LUQ port placement was performed, and robotic docking was performed. Round ligaments were divided on each side and the retroperitoneal space was opened bilaterally. The infundibulopelvic ligaments were skeletonized, and the ureters were identified and preserved.  The retroperitoneal spaces were opened and sentinel nodes identified as well as the iliac vessels and obturator nerves. The sentinel nodes were removed and placed in th paravesical spaces.   The bilateral infundibular ligaments were sealed and divided.  A bladder flap was created and the bladder was dissected down off the lower uterine segment and cervix. The uterus was manipulated to allow exposure of the uterine arteries. The uterine arteries were skeletonized bilaterally, sealed and divided with cautery and transected.  A colpotomy was performed circumferentially along the V-Care ring with electrocautery and the cervix was incised from the vagina. The V-care and specimen was removed.  A pneumo balloon was placed in the vagina and ring forceps used to removed both sentinel lymph nodes. The vaginal cuff was then closed in a running continuous fashion using 0 V-Lock suture with careful attention to include the vaginal cuff angles and the vaginal mucosa  within the closure.    Hemostasis was observed. The  intraperitoneal pressure was dropped, and all planes of dissection, vascular pedicles and the vaginal cuff were found to be hemostatic. The trocars were removed and the skin incision with subcuticular stitch and Indermil glue.  The patient tolerated the procedure well.  Sponge, lap and needle counts were correct x2.  The patient was taken to recovery room in excellent condition.  Antibiotics: Given 1st or 2nd generation cephalosporin, Antibiotics given within 1 hour of the start of the procedure, Antibiotics ordered to be discontinued within 24 hours post procedure   VTE prophylaxis: was ordered perioperatively.   Dr. Connell assisted me with the sentinel lymph node mapping and biopsies which could not have been performed without her assistance. This was a high-level case requiring a retail buyer. Dr. Connell performed the initial abdominal entry, conversion of initial umbilical port to robotic port, docking, and insertion of robotic instruments while I performed the vaginal part of the procedure and SLN injection.She also provided high level assistance throughout the procedure including SLN and half of the hysterectomy. I assisted Dr. Connell with the remainder of the hysterectomy and she performed the vaginal cuff closure, robot undocking, removal of robotic instruments, and skin closures.    ELBY WEBB LOGES, MD

## 2023-06-15 ENCOUNTER — Encounter: Payer: Self-pay | Admitting: Obstetrics and Gynecology

## 2023-06-15 ENCOUNTER — Telehealth: Payer: Self-pay | Admitting: Obstetrics & Gynecology

## 2023-06-15 NOTE — Telephone Encounter (Signed)
 LM Just checking on her

## 2023-06-16 LAB — SURGICAL PATHOLOGY

## 2023-06-16 LAB — CYTOLOGY - NON PAP

## 2023-06-18 ENCOUNTER — Other Ambulatory Visit: Payer: Self-pay | Admitting: Obstetrics and Gynecology

## 2023-06-21 ENCOUNTER — Ambulatory Visit: Payer: BLUE CROSS/BLUE SHIELD

## 2023-06-27 NOTE — Progress Notes (Deleted)
    OBSTETRICS/GYNECOLOGY POST-OPERATIVE CLINIC VISIT  Subjective:     Natasha Vargas is a 53 y.o. female who presents to the clinic 2 weeks status post XI ROBOTIC ASSISTED TOTAL HYSTERECTOMY WITH BILATERAL SALPINGO OOPHORECTOMY, PELVIC SENTINEL LYMPH NODE MAPPING AND BIOPSIES, POSSIBLE PELVIC/AORTIC NODE SAMPLING, LAPAROTOMY  for  Endometrial cancer . Eating a regular diet {with-without:5700} difficulty. Bowel movements are {normal/abnormal***:19619}. {pain control:13522::"The patient is not having any pain."}  {Common ambulatory SmartLinks:19316}  Review of Systems {ros; complete:30496}   Objective:   There were no vitals taken for this visit. There is no height or weight on file to calculate BMI.  General:  alert and no distress  Abdomen: soft, bowel sounds active, non-tender  Incision:   {incision:13716::"no dehiscence","incision well approximated","healing well","no drainage","no erythema","no hernia","no seroma","no swelling"}    Pathology:    Assessment:   Patient s/p XI ROBOTIC ASSISTED TOTAL HYSTERECTOMY WITH BILATERAL SALPINGO OOPHORECTOMY, PELVIC SENTINEL LYMPH NODE MAPPING AND BIOPSIES, POSSIBLE PELVIC/AORTIC NODE SAMPLING, LAPAROTOMY  (surgery)  {doing well:13525::"Doing well postoperatively."}   Plan:   1. Continue any current medications as instructed by provider. 2. Wound care discussed. 3. Operative findings again reviewed. Pathology report discussed. 4. Activity restrictions: {restrictions:13723} 5. Anticipated return to work: {work return:14002}. 6. Follow up: {4-09:81191} {time; units:18646} for ***    Hildred Laser, MD [facility]

## 2023-06-28 ENCOUNTER — Encounter: Payer: BLUE CROSS/BLUE SHIELD | Admitting: Obstetrics and Gynecology

## 2023-06-28 ENCOUNTER — Encounter: Payer: Self-pay | Admitting: Obstetrics and Gynecology

## 2023-06-28 NOTE — Progress Notes (Signed)
I called the patient with her pathology results noted below. She will follow up in clinic as scheduled. Her risk of recurrence is very low <5%. However, close follow up is still recommended. We will review these findings again at her postop visit.    SURGICAL PATHOLOGY [facility] [address], [address] [place], [place] [address] Telephone [phone] or [phone] Fax [phone]  REPORT OF SURGICAL PATHOLOGY   Accession #: [id number] Patient Name: Natasha Vargas, Natasha Vargas Visit # : [id number]  MRN: [id number] Physician: Artelia Laroche DOB/Age 09/13/1970 (Age: 53) Gender: F Collected Date: 06/14/2023 Received Date: 06/14/2023  FINAL DIAGNOSIS       1. 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      - SEE CANCER SUMMARY BELOW.      - MYOMETRIUM WITH ADENOMYOSIS.      - ENDOCERVIX WITH NABOTHIAN CYSTS.      - BILATERAL FALLOPIAN TUBES WITH FIMBRIATED END.      - LEFT PARATUBAL CYST.      - BILATERAL UNREMARKABLE OVARIES.       2. Lymph node, sentinel, biopsy, Right obturator :      - TWO LYMPH NODES NEGATIVE FOR MALIGNANCY (0/2).       3. Lymph node, sentinel, biopsy, Left external iliac :      - ONE LYMPH NODE NEGATIVE FOR MALIGNANCY (0/1).       Diagnosis Note : See concurrent case [id number].      ELECTRONIC SIGNATURE : Rubinas Md, Delice Bison , Sports administrator, Electronic Signature  MICROSCOPIC DESCRIPTION 1. CASE SUMMARY: (ENDOMETRIUM) Standard(s): AJCC 8, FIGO 2009 Staging (2018 Annual Report), FIGO 2023 Staging  SPECIMEN Procedure: Total hysterectomy with bilateral salpingo-oophorectomy  TUMOR Histologic Type: Endometrioid carcinoma Histologic Grade: FIGO grade 1 Molecular Type: MMR Immunohistochemistry: Intact nuclear expression of MLH1, PMS2, MSH2, and MSH6 p53 Immunohistochemistry: Normal  (wild-type) expression     Myometrial Invasion: Present, inner half (less than 50%) Percentage: 33% Uterine Serosa Involvement: Not identified Cervical Involvement: Not identified Other Tissue/Organ Involvement: Not identified Lymphatic and/or Vascular Invasion: Not identified  MARGINS Margin Status: Not applicable  REGIONAL LYMPH NODES Regional Lymph Node Status: All regional lymph nodes negative for tumor cells Lymph Nodes Examined: Total Number of Pelvic Nodes Examined (sentinel and non-sentinel): 3 Number of Pelvic Sentinel Nodes Examined: 3 Total Number of Para-aortic Nodes Examined (sentinel and non-sentinel): 0 Number of Para-aortic Sentinel Nodes Examined: 0 DISTANT METASTASIS Distant Site(s) Involved, if applicable: Not applicable  PATHOLOGIC STAGE CLASSIFICATION (pTNM, AJCC 8th Edition): Modified Classification: Not applicable pT1a T Suffix: Not applicable pN0 N Suffix: (sn) pM - Not applicable    Washings - negative  Elania Crowl Leta Jungling, MD

## 2023-06-30 NOTE — Progress Notes (Unsigned)
    OBSTETRICS/GYNECOLOGY POST-OPERATIVE CLINIC VISIT  Subjective:     Natasha Vargas is a 53 y.o. female who presents to the clinic 2 weeks status post XI ROBOTIC ASSISTED TOTAL HYSTERECTOMY WITH BILATERAL SALPINGO OOPHORECTOMY, PELVIC SENTINEL LYMPH NODE MAPPING AND BIOPSIES, POSSIBLE PELVIC/AORTIC NODE SAMPLING, LAPAROTOMY  for  Endometrial cancer . Eating a regular diet {with-without:5700} difficulty. Bowel movements are {normal/abnormal***:19619}. {pain control:13522::"The patient is not having any pain."}  {Common ambulatory SmartLinks:19316}  Review of Systems {ros; complete:30496}   Objective:   There were no vitals taken for this visit. There is no height or weight on file to calculate BMI.  General:  alert and no distress  Abdomen: soft, bowel sounds active, non-tender  Incision:   {incision:13716::"no dehiscence","incision well approximated","healing well","no drainage","no erythema","no hernia","no seroma","no swelling"}    Pathology:    Assessment:   Patient s/p XI ROBOTIC ASSISTED TOTAL HYSTERECTOMY WITH BILATERAL SALPINGO OOPHORECTOMY, PELVIC SENTINEL LYMPH NODE MAPPING AND BIOPSIES, POSSIBLE PELVIC/AORTIC NODE SAMPLING, LAPAROTOMY  (surgery)  {doing well:13525::"Doing well postoperatively."}   Plan:   1. Continue any current medications as instructed by provider. 2. Wound care discussed. 3. Operative findings again reviewed. Pathology report discussed. 4. Activity restrictions: {restrictions:13723} 5. Anticipated return to work: {work return:14002}. 6. Follow up: {1-61:09604} {time; units:18646} for ***    Hildred Laser, MD [facility]

## 2023-07-04 ENCOUNTER — Ambulatory Visit (INDEPENDENT_AMBULATORY_CARE_PROVIDER_SITE_OTHER): Payer: BC Managed Care – PPO | Admitting: Obstetrics and Gynecology

## 2023-07-04 ENCOUNTER — Encounter: Payer: Self-pay | Admitting: Obstetrics and Gynecology

## 2023-07-04 VITALS — BP 113/50 | HR 79 | Resp 16 | Ht 61.0 in | Wt 217.6 lb

## 2023-07-04 DIAGNOSIS — Z4889 Encounter for other specified surgical aftercare: Secondary | ICD-10-CM

## 2023-07-04 DIAGNOSIS — C541 Malignant neoplasm of endometrium: Secondary | ICD-10-CM

## 2023-07-04 DIAGNOSIS — Z90710 Acquired absence of both cervix and uterus: Secondary | ICD-10-CM

## 2023-07-05 ENCOUNTER — Telehealth: Payer: Self-pay | Admitting: Internal Medicine

## 2023-07-19 ENCOUNTER — Inpatient Hospital Stay: Payer: BC Managed Care – PPO | Attending: Oncology | Admitting: Obstetrics and Gynecology

## 2023-07-19 VITALS — BP 114/53 | HR 94 | Temp 98.7°F | Resp 18 | Wt 215.6 lb

## 2023-07-19 DIAGNOSIS — Z9079 Acquired absence of other genital organ(s): Secondary | ICD-10-CM

## 2023-07-19 DIAGNOSIS — Z90710 Acquired absence of both cervix and uterus: Secondary | ICD-10-CM

## 2023-07-19 DIAGNOSIS — C541 Malignant neoplasm of endometrium: Secondary | ICD-10-CM

## 2023-07-19 DIAGNOSIS — Z90722 Acquired absence of ovaries, bilateral: Secondary | ICD-10-CM

## 2023-07-19 NOTE — Progress Notes (Signed)
Gynecologic Oncology H&P    Referring Provider: Dr. Marice Potter   Chief Concern: Endometrial cancer stage IA, post op visit Subjective:  Natasha Vargas is a 53 y.o. female who is seen in consultation from Dr. Marice Potter for endometrial adenocarcinoma.   Underwent TLH/BSO and SLN biopsies 06/14/23 with Dr Sonia Side at [facility].   FINAL DIAGNOSIS      1. 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      - MYOMETRIUM WITH ADENOMYOSIS.      - ENDOCERVIX WITH NABOTHIAN CYSTS.      - BILATERAL FALLOPIAN TUBES WITH FIMBRIATED END.      - LEFT PARATUBAL CYST.      - BILATERAL UNREMARKABLE OVARIES.       2. Lymph node, sentinel, biopsy, Right obturator :      - TWO LYMPH NODES NEGATIVE FOR MALIGNANCY (0/2).       3. Lymph node, sentinel, biopsy, Left external iliac :      - ONE LYMPH NODE NEGATIVE FOR MALIGNANCY (0/1).  TUMOR Histologic Type: Endometrioid carcinoma Histologic Grade: FIGO grade 1 Molecular Type: MMR Immunohistochemistry: Intact nuclear expression of MLH1, PMS2, MSH2, and MSH6 p53 Immunohistochemistry: Normal (wild-type) expression     Myometrial Invasion: Present, inner half (less than 50%) Percentage: 33% Uterine Serosa Involvement: Not identified Cervical Involvement: Not identified Other Tissue/Organ Involvement: Not identified Lymphatic and/or Vascular Invasion: Not identified Washings negative  Oncology history Patient developed PMB and saw Dr Marice Potter 04/25/23.  Patient is a 53 y.o. married G1P0101 for evaluation of PMB. She went through menopause around 4 years ago. 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 She was prescribed vaginal estrogen but told her primary care provider that she still had the dyspareunia. She was prescribed estrogen patch. She used this (no progesterone) until prometrium was prescribed  10/2022. She started PMB about 2 months ago. She was given 30 days of provera.   04/25/23 ENDOMETRIUM, BIOPSY: Endometrioid adenocarcinoma, FIGO grade 2. COMMENT: Histochemical stains reveal tumor cells are positive for ER, PR and show diffuse p16 staining.  P53 is wild-type.  Controls are adequate.     No bleeding now.  No other complaints.    Problem List:     Patient Active Problem List    Diagnosis Date Noted   Endometrial cancer (HCC) 05/03/2023   BMI 35.0-35.9,adult 05/12/2021   Chest tightness 05/12/2021   Dyspnea on exertion 05/12/2021   Chronic pain 05/19/2020   Sciatica 05/19/2020   Severe obesity (BMI >= 40) (HCC) 04/12/2019   Anxiety and depression 04/12/2019   Severe anxiety 04/12/2019   Positive colorectal cancer screening using Cologuard test 02/13/2019   Shoulder pain, right 12/03/2018   Scoliosis 12/01/2018   Trigger finger of right thumb 12/10/2015   Vitamin D deficiency 12/07/2015   Severe obesity (BMI 35.0-35.9 with comorbidity) (HCC) 12/03/2015   Carpal tunnel syndrome on right 07/09/2015   Acquired hallux rigidus, left 12/30/2014   Acquired hallux rigidus, right 12/30/2014   Bilateral leg and foot pain 12/30/2014   MDD (major depressive disorder) 12/23/2014   Hyperlipidemia 10/01/2014      Past Medical History:     Past Medical History:  Diagnosis Date   Anxiety     Arthritis     Depression     Migraines  Past Surgical History:      Past Surgical History:  Procedure Laterality Date   CHOLECYSTECTOMY   2004          OB History:                  OB History  Gravida Para Term Preterm AB Living  1 1   1   1   SAB IAB Ectopic Multiple Live Births                      # Outcome Date GA Lbr Len/2nd Weight Sex Type Anes PTL Lv  1 Preterm                        Family History:      Family History  Problem Relation Age of Onset   Hypertension Mother     Heart disease Mother     Diabetes Mother     Glaucoma Mother      Obesity Father     Hypertension Father            Social History: Social History         Socioeconomic History   Marital status: Married      Spouse name: Not on file   Number of children: Not on file   Years of education: Not on file   Highest education level: Not on file  Occupational History   Not on file  Tobacco Use   Smoking status: Never      Passive exposure: Never   Smokeless tobacco: Never  Substance and Sexual Activity   Alcohol use: Not on file   Drug use: Never   Sexual activity: Yes      Birth control/protection: Surgical  Other Topics Concern   Not on file  Social History Narrative   Not on file    Social Determinants of Health        Financial Resource Strain: Not on file  Food Insecurity: Not on file  Transportation Needs: Not on file  Physical Activity: Not on file  Stress: Not on file  Social Connections: Unknown (10/17/2021)    Received from [facility]    Social Network     Social Network: Not on file  Intimate Partner Violence: Unknown (09/08/2021)    Received from [facility], [facility]    HITS     Physically Hurt: Not on file     Insult or Talk Down To: Not on file     Threaten Physical Harm: Not on file     Scream or Curse: Not on file      Allergies: Allergies       Allergies  Allergen Reactions   Other        Artifical Sweetners- Headaches   Omega-3 Other (See Comments)      Artifical Sweetners- Headaches  Artifical Sweetners- Headaches   Bee Pollen Other (See Comments)   Latex Hives and Itching      Skin peels    Other reaction(s): Unknown, Unknown  Skin peels   Skin peels   Skin peels   Skin peels   Skin peels   Skin peels   Skin peels   Skin peels    Skin peels   Skin peels   Skin peels   Skin peels    Skin peels   Skin peels    Skin peels     Skin peels  Skin peels  Skin peels  Skin peels     Skin peels  Skin peels     Other reaction(s): Unknown, Unknown Skin peels  Skin peels   Skin peels  Skin peels  Skin peels  Skin peels  Skin peels  Skin peels     Skin peels   Pollen Extract     Egg-Derived Products Other (See Comments)      headache   headache  headache   headache headache   Wheat Hives and Other (See Comments)      Other reaction(s): Unknown        Current Medications:       Current Outpatient Medications  Medication Sig Dispense Refill   b complex vitamins capsule Take 1 capsule by mouth daily.       buPROPion (WELLBUTRIN XL) 300 MG 24 hr tablet         busPIRone (BUSPAR) 15 MG tablet Take by mouth.       citalopram (CELEXA) 10 MG tablet TAKE 1 TABLET BY MOUTH EVERY DAY 90 tablet 0   estradiol (ESTRACE) 0.1 MG/GM vaginal cream Use 1 g vaginally every night for 2 weeks then two to three times per week       Ipratropium-Albuterol (COMBIVENT RESPIMAT) 20-100 MCG/ACT AERS respimat Take 2 puffs 3 x day for asthma 4 g 3   Multiple Vitamins-Minerals (MULTIVITAMIN-MINERALS PO) Take by mouth.       nortriptyline (PAMELOR) 10 MG capsule Take 40 mg by mouth at bedtime.       rizatriptan (MAXALT) 10 MG tablet take 10 mg at headache onset. Take another tablet in 2 hours if needed. Take no more than 2 tablets in a 24 hour period.       SUMAtriptan (IMITREX) 100 MG tablet Take one tablet by mouth for migraine.  Repeat in 2 hrs if not effective. Max dose of 2 doses in 24hrs.       tiZANidine (ZANAFLEX) 4 MG tablet Take by mouth.       VITAMIN D, CHOLECALCIFEROL, PO Take by mouth.       betamethasone dipropionate 0.05 % cream Apply topically.       fish oil-omega-3 fatty acids 1000 MG capsule Take by mouth.       progesterone (PROMETRIUM) 100 MG capsule Take by mouth.          No current facility-administered medications for this visit.        Review of Systems General: negative for, fevers, chills, fatigue, changes in sleep, changes in weight or appetite Skin: negative for changes in color, texture, moles or lesions Eyes: negative for, changes in pain,  diplopia.   HEENT: negative for, change in hearing, pain, discharge, tinnitus, vertigo, voice changes, sore throat, neck masses Pulmonary: negative for, dyspnea, orthopnea, productive cough Cardiac: negative for, palpitations, syncope, pain, discomfort, pressure.  Gastrointestinal: negative for, dysphagia, nausea, vomiting, jaundice, pain, constipation, diarrhea, hematemesis, hematochezia Genitourinary/Sexual: negative for, dysuria, discharge, hesitancy, nocturia, retention, stones, infections, STD's, incontinence Musculoskeletal: negative for, pain, stiffness, swelling, range of motion limitation.   Hematology: negative for, easy bruising, bleeding Neurologic/Psych: negative for, seizures, paralysis, weakness, tremor, change in gait, change in sensation, mood swings, depression, anxiety, change in memory.    Objective:  Physical Examination:  BP (!) 137/59   Pulse 79   Temp 98.6 F (37 C)   Resp 20   Wt 214 lb 8 oz (97.3 kg)   SpO2 100%   BMI 40.53 kg/m  ECOG Performance Status: 0, Asymptomatic   General appearance: alert, cooperative, and appears stated age HEENT:PERRLA and thyroid without masses Lymph node survey: non-palpable, axillary, inguinal, supraclavicular Cardiovascular: regular rate and rhythm, no murmurs or gallops Respiratory: normal air entry, lungs clear to auscultation and no rales, rhonchi or wheezing Abdomen: soft, non-tender, without masses or organomegaly, nondistended, no hernias, and well healed incisions Back: inspection of back is normal Extremities: extremities normal, atraumatic, no cyanosis or edema Skin exam - normal coloration and turgor, no rashes, no suspicious skin lesions noted. Neurological exam reveals alert, oriented, normal speech, no focal findings or movement disorder noted.   Pelvic: exam chaperoned by nurse, EGBUS within normal limits, normal vagina and vulva;  Cuff well healed.  Bimanual; normal. Rectal: not indicated   Assessment:   Natasha Vargas is a 53 y.o. P23 female with PMB diagnosed with grade 2 endometrial adenocarcinoma on EMB.  Underwent Robotic TLH/BSO and SLN biopsies 06/14/23 with Dr Judithe Modest at [facility]. Pathology showed Stage IA grade 1 endometrioid adenocarcinoma with mucinous differentiation, invading 33% of the uterine wall.  No LVSI, washings SLNs, adnexa and cervix.       MMR Immunohistochemistry: Intact nuclear expression of MLH1, PMS2, MSH2, and MSH6 p53 Immunohistochemistry: Normal (wild-type) expression   ER/PR positive  Medical co-morbidities complicating care: Marland Kitchen  Plan:    Problem List Items Addressed This Visit              Genitourinary    Endometrial cancer (HCC) - Primary    Discussed that the risk of cancer recurrence is very low (<5%) and that adjuvant therapy was not indicated.   Suggested return to clinic to see [place] in 6 months and Dr Valentino Saxon in 12 months and will continue alternating visits.  . .     The patient's diagnosis, an outline of the further diagnostic and laboratory studies which will be required, the recommendation, and alternatives were discussed.  All questions were answered to the patient's satisfaction.   Leida Lauth, MD   CC:  Allie Bossier, MD [address] [place],  [place] [address] [phone]

## 2023-08-06 ENCOUNTER — Other Ambulatory Visit: Payer: Self-pay | Admitting: Internal Medicine

## 2023-08-06 DIAGNOSIS — F411 Generalized anxiety disorder: Secondary | ICD-10-CM

## 2023-08-07 ENCOUNTER — Telehealth: Payer: Self-pay

## 2023-08-07 NOTE — Telephone Encounter (Signed)
 Lmom to call us back

## 2023-08-09 ENCOUNTER — Encounter: Payer: Self-pay | Admitting: Obstetrics and Gynecology

## 2023-09-15 ENCOUNTER — Ambulatory Visit (INDEPENDENT_AMBULATORY_CARE_PROVIDER_SITE_OTHER): Admitting: Physician Assistant

## 2023-09-15 ENCOUNTER — Encounter: Payer: Self-pay | Admitting: Physician Assistant

## 2023-09-15 VITALS — BP 121/70 | HR 90 | Temp 98.6°F | Resp 16 | Ht 61.0 in | Wt 219.0 lb

## 2023-09-15 DIAGNOSIS — L309 Dermatitis, unspecified: Secondary | ICD-10-CM | POA: Diagnosis not present

## 2023-09-15 MED ORDER — TRIAMCINOLONE ACETONIDE 0.1 % EX CREA: 1.0000 | TOPICAL_CREAM | Freq: Two times a day (BID) | CUTANEOUS | 0 refills | Status: DC

## 2023-09-15 NOTE — Progress Notes (Signed)
 [facility] [address] [place], [place] [address]  Internal MEDICINE  Office Visit Note  Patient Name: Natasha Vargas  [id number]  [id number]  Date of Service: 09/15/2023  Chief Complaint  Patient presents with   Acute Visit   Rash    Behind ear, ongoing for about 1.5 months.     HPI Pt is here for a sick visit. -Rash behind left ear. Started 1.5 months ago. Today doesn't feel bad, but at times can feel a crust on it and it will split and drain clear liquid at times -Hx of eczema and contact dermatitis. Has very dry skin that she manages. Typically worse on hands. -Tried neosporin, vaseline, and Antifungal cream, but has not helped -Has increased in size some.  -not very itchy, not painful unless it starts draining after picking at it  Current Medication:  Outpatient Encounter Medications as of 09/15/2023  Medication Sig   aspirin EC 81 MG tablet Take 81 mg by mouth daily. Swallow whole.   b complex vitamins capsule Take 1 capsule by mouth daily.   cholecalciferol (VITAMIN D3) 25 MCG (1000 UNIT) tablet Take 1,000 Units by mouth daily.   citalopram  (CELEXA ) 10 MG tablet TAKE 1 TABLET BY MOUTH EVERY DAY   docusate sodium  (COLACE) 100 MG capsule Take 1 capsule (100 mg total) by mouth 2 (two) times daily as needed.   ferrous sulfate 325 (65 FE) MG tablet Take 325 mg by mouth every other day.   ibuprofen  (ADVIL ) 600 MG tablet Take 1 tablet (600 mg total) by mouth every 6 (six) hours as needed.   Ipratropium-Albuterol (COMBIVENT  RESPIMAT) 20-100 MCG/ACT AERS respimat Inhale 1 puff into the lungs every 6 (six) hours as needed for wheezing or shortness of breath.   loratadine (CLARITIN) 10 MG tablet Take 10 mg by mouth daily.   Multiple Vitamins-Minerals (MULTIVITAMIN-MINERALS PO) Take 1 tablet by mouth daily.   nortriptyline (PAMELOR) 10 MG capsule Take 40 mg by mouth at bedtime.   oxyCODONE -acetaminophen  (PERCOCET) 5-325 MG tablet Take 1 tablet by mouth every 4  (four) hours as needed for severe pain (pain score 7-10).   rizatriptan (MAXALT) 10 MG tablet Take 10 mg by mouth as needed for migraine.   Simethicone  80 MG TABS Take 1 tablet (80 mg total) by mouth 4 (four) times daily as needed (gas, bloating).   triamcinolone  cream (KENALOG ) 0.1 % Apply 1 Application topically 2 (two) times daily.   vitamin E 180 MG (400 UNITS) capsule Take 400 Units by mouth daily.   zinc gluconate 50 MG tablet Take 50 mg by mouth daily.   No facility-administered encounter medications on file as of 09/15/2023.      Medical History: Past Medical History:  Diagnosis Date   Anxiety    Arthritis    Atrophic vaginitis    Carpal tunnel syndrome on right 2014   Depression    Dyspnea on exertion    Endometrial adenocarcinoma (HCC) 05/2023   Exercise-induced asthma    GERD (gastroesophageal reflux disease)    Hyperlipidemia    Iron deficiency    Migraines    Pneumonia    PONV (postoperative nausea and vomiting)    Preeclampsia 1990   seizure   Severe obesity (BMI >= 40) (HCC)    Vitamin B12 deficiency    Vitamin D  deficiency      Vital Signs: BP 121/70   Pulse 90   Temp 98.6 F (37 C)   Resp 16   Ht 5'  1" (1.549 m)   Wt 219 lb (99.3 kg)   SpO2 98%   BMI 41.38 kg/m    Review of Systems  Constitutional:  Negative for fatigue and fever.  HENT:  Negative for congestion, ear discharge, hearing loss, mouth sores and postnasal drip.   Respiratory:  Negative for cough.   Cardiovascular:  Negative for chest pain.  Genitourinary:  Negative for flank pain.  Skin:  Positive for rash.  Psychiatric/Behavioral: Negative.      Physical Exam Vitals and nursing note reviewed.  Constitutional:      Appearance: Normal appearance.  HENT:     Head: Normocephalic and atraumatic.  Cardiovascular:     Rate and Rhythm: Normal rate and regular rhythm.  Pulmonary:     Effort: Pulmonary effort is normal.     Breath sounds: Normal breath sounds.  Skin:     Findings: Rash present.     Comments: Rash behind left ear  Neurological:     Mental Status: She is alert.  Psychiatric:        Mood and Affect: Mood normal.        Behavior: Behavior normal.       Assessment/Plan: 1. Eczema, unspecified type (Primary) May use kenalog  cream as needed, switch to daily moisturizer once improving to avoid excess steroid use - triamcinolone  cream (KENALOG ) 0.1 %; Apply 1 Application topically 2 (two) times daily.  Dispense: 30 g; Refill: 0   General Counseling: Darlina verbalizes understanding of the findings of todays visit and agrees with plan of treatment. I have discussed any further diagnostic evaluation that may be needed or ordered today. We also reviewed her medications today. she has been encouraged to call the office with any questions or concerns that should arise related to todays visit.    Counseling:    No orders of the defined types were placed in this encounter.   Meds ordered this encounter  Medications   triamcinolone  cream (KENALOG ) 0.1 %    Sig: Apply 1 Application topically 2 (two) times daily.    Dispense:  30 g    Refill:  0    Time spent:25 Minutes

## 2023-09-29 ENCOUNTER — Encounter: Payer: Self-pay | Admitting: Internal Medicine

## 2023-10-01 ENCOUNTER — Encounter: Payer: Self-pay | Admitting: Internal Medicine

## 2023-10-02 ENCOUNTER — Other Ambulatory Visit: Payer: Self-pay | Admitting: Internal Medicine

## 2023-10-02 DIAGNOSIS — Z1231 Encounter for screening mammogram for malignant neoplasm of breast: Secondary | ICD-10-CM

## 2023-10-03 ENCOUNTER — Telehealth: Payer: Self-pay

## 2023-10-03 NOTE — Telephone Encounter (Signed)
 Pt advised to go head call to schedule mammogram and if they need additional test they can call toni

## 2023-10-10 ENCOUNTER — Other Ambulatory Visit: Payer: Self-pay | Admitting: Internal Medicine

## 2023-10-10 DIAGNOSIS — N63 Unspecified lump in unspecified breast: Secondary | ICD-10-CM

## 2023-10-10 DIAGNOSIS — Z1231 Encounter for screening mammogram for malignant neoplasm of breast: Secondary | ICD-10-CM

## 2023-10-10 DIAGNOSIS — N6312 Unspecified lump in the right breast, upper inner quadrant: Secondary | ICD-10-CM

## 2023-10-16 ENCOUNTER — Ambulatory Visit
Admission: RE | Admit: 2023-10-16 | Discharge: 2023-10-16 | Disposition: A | Discharge status: Home / Self Care | Source: Ambulatory Visit | Attending: Internal Medicine | Admitting: Internal Medicine

## 2023-10-16 DIAGNOSIS — N6312 Unspecified lump in the right breast, upper inner quadrant: Secondary | ICD-10-CM

## 2023-10-16 DIAGNOSIS — Z1231 Encounter for screening mammogram for malignant neoplasm of breast: Secondary | ICD-10-CM | POA: Diagnosis not present

## 2023-10-16 DIAGNOSIS — R92313 Mammographic fatty tissue density, bilateral breasts: Secondary | ICD-10-CM | POA: Diagnosis not present

## 2023-10-16 DIAGNOSIS — N63 Unspecified lump in unspecified breast: Secondary | ICD-10-CM

## 2023-10-26 DIAGNOSIS — Z1331 Encounter for screening for depression: Secondary | ICD-10-CM | POA: Diagnosis not present

## 2023-10-26 DIAGNOSIS — Z8669 Personal history of other diseases of the nervous system and sense organs: Secondary | ICD-10-CM | POA: Diagnosis not present

## 2023-10-26 DIAGNOSIS — R42 Dizziness and giddiness: Secondary | ICD-10-CM | POA: Diagnosis not present

## 2023-10-26 DIAGNOSIS — R519 Headache, unspecified: Secondary | ICD-10-CM | POA: Diagnosis not present

## 2023-10-26 DIAGNOSIS — H538 Other visual disturbances: Secondary | ICD-10-CM | POA: Diagnosis not present

## 2023-11-03 ENCOUNTER — Other Ambulatory Visit: Payer: Self-pay | Admitting: Internal Medicine

## 2023-11-03 DIAGNOSIS — F411 Generalized anxiety disorder: Secondary | ICD-10-CM

## 2023-12-20 DIAGNOSIS — F411 Generalized anxiety disorder: Secondary | ICD-10-CM | POA: Diagnosis not present

## 2023-12-20 DIAGNOSIS — F330 Major depressive disorder, recurrent, mild: Secondary | ICD-10-CM | POA: Diagnosis not present

## 2023-12-20 DIAGNOSIS — F4312 Post-traumatic stress disorder, chronic: Secondary | ICD-10-CM | POA: Diagnosis not present

## 2023-12-22 DIAGNOSIS — F330 Major depressive disorder, recurrent, mild: Secondary | ICD-10-CM | POA: Diagnosis not present

## 2023-12-22 DIAGNOSIS — F411 Generalized anxiety disorder: Secondary | ICD-10-CM | POA: Diagnosis not present

## 2023-12-26 DIAGNOSIS — F331 Major depressive disorder, recurrent, moderate: Secondary | ICD-10-CM | POA: Diagnosis not present

## 2023-12-26 DIAGNOSIS — F4312 Post-traumatic stress disorder, chronic: Secondary | ICD-10-CM | POA: Diagnosis not present

## 2023-12-26 DIAGNOSIS — F411 Generalized anxiety disorder: Secondary | ICD-10-CM | POA: Diagnosis not present

## 2023-12-31 ENCOUNTER — Ambulatory Visit (INDEPENDENT_AMBULATORY_CARE_PROVIDER_SITE_OTHER)

## 2023-12-31 ENCOUNTER — Ambulatory Visit: Payer: Self-pay | Admitting: Nurse Practitioner

## 2023-12-31 ENCOUNTER — Ambulatory Visit
Admission: EM | Admit: 2023-12-31 | Discharge: 2023-12-31 | Disposition: A | Discharge status: Home / Self Care | Attending: Nurse Practitioner | Admitting: Nurse Practitioner

## 2023-12-31 DIAGNOSIS — J209 Acute bronchitis, unspecified: Secondary | ICD-10-CM | POA: Diagnosis not present

## 2023-12-31 DIAGNOSIS — J45901 Unspecified asthma with (acute) exacerbation: Secondary | ICD-10-CM | POA: Diagnosis not present

## 2023-12-31 DIAGNOSIS — R059 Cough, unspecified: Secondary | ICD-10-CM | POA: Diagnosis not present

## 2023-12-31 DIAGNOSIS — R051 Acute cough: Secondary | ICD-10-CM | POA: Diagnosis not present

## 2023-12-31 DIAGNOSIS — R062 Wheezing: Secondary | ICD-10-CM | POA: Diagnosis not present

## 2023-12-31 MED ORDER — PROMETHAZINE-DM 6.25-15 MG/5ML PO SYRP: 10.0000 mL | ORAL_SOLUTION | Freq: Four times a day (QID) | ORAL | 0 refills | Status: DC | PRN

## 2023-12-31 MED ORDER — COMBIVENT RESPIMAT 20-100 MCG/ACT IN AERS: 1.0000 | INHALATION_SPRAY | Freq: Four times a day (QID) | RESPIRATORY_TRACT | 0 refills | Status: AC | PRN

## 2023-12-31 MED ORDER — PREDNISONE 10 MG (21) PO TBPK: ORAL_TABLET | Freq: Every day | ORAL | 0 refills | Status: DC

## 2023-12-31 MED ORDER — AMOXICILLIN-POT CLAVULANATE 875-125 MG PO TABS: 1.0000 | ORAL_TABLET | Freq: Two times a day (BID) | ORAL | 0 refills | Status: DC

## 2023-12-31 MED ORDER — IPRATROPIUM-ALBUTEROL 0.5-2.5 (3) MG/3ML IN SOLN
3.0000 mL | Freq: Once | RESPIRATORY_TRACT | Status: AC
  Administered 2023-12-31: 3 mL via RESPIRATORY_TRACT

## 2023-12-31 MED ORDER — MUCINEX DM MAXIMUM STRENGTH 60-1200 MG PO TB12: 1.0000 | ORAL_TABLET | Freq: Two times a day (BID) | ORAL | 0 refills | Status: DC

## 2023-12-31 NOTE — ED Provider Notes (Signed)
 CAY RALPH PELT    CSN: [id number] Arrival date & time: 12/31/23  1056      History   Chief Complaint Chief Complaint  Patient presents with   Cough   Wheezing    HPI Natasha Vargas is a 53 y.o. female.   Discussed the use of AI scribe software for clinical note transcription with the patient, who gave verbal consent to proceed.   Patient, with a history of asthma, presents with a dry cough and worsening wheezing for the past two weeks. The patient initially attributed symptoms to humidity and used an inhaler without improvement. The cough has been persistent for about two weeks. The patient experienced chest tightness and congestion, describing it as feeling like I had pneumonia. This also has caused some back pain. Today, the patient reports no pain. Associated symptoms include easy fatigue, stating I get winded real easy. The patient denies fever, chills, body aches, swelling in feet, legs, or ankles, chest pain, runny nose, congestion, or sore throat.  The patient reports difficulty coughing up secretions, describing it as having the urge to cough but nothing's coming up. Yesterday, the coughing was severe enough to cause a headache, with the patient stating, I felt like my head was going to explode. The patient quit smoking in 1993 and denies current smoking or vaping. The patient has been using an inhaler but reports it hasn't helped. No other treatments have been attempted.  The following portions of the patient's history were reviewed and updated as appropriate: allergies, current medications, past family history, past medical history, past social history, past surgical history, and problem list.    Past Medical History:  Diagnosis Date   Anxiety    Arthritis    Atrophic vaginitis    Carpal tunnel syndrome on right 2014   Depression    Dyspnea on exertion    Endometrial adenocarcinoma (HCC) 05/2023   Exercise-induced asthma    GERD (gastroesophageal  reflux disease)    Hyperlipidemia    Iron deficiency    Migraines    Pneumonia    PONV (postoperative nausea and vomiting)    Preeclampsia 1990   seizure   Severe obesity (BMI >= 40) (HCC)    Vitamin B12 deficiency    Vitamin D  deficiency     Patient Active Problem List   Diagnosis Date Noted   Endometrial cancer (HCC) 05/03/2023   BMI 35.0-35.9,adult 05/12/2021   Chest tightness 05/12/2021   Dyspnea on exertion 05/12/2021   Chronic pain 05/19/2020   Sciatica 05/19/2020   Severe obesity (BMI >= 40) (HCC) 04/12/2019   Anxiety and depression 04/12/2019   Severe anxiety 04/12/2019   Positive colorectal cancer screening using Cologuard test 02/13/2019   Shoulder pain, right 12/03/2018   Scoliosis 12/01/2018   Trigger finger of right thumb 12/10/2015   Vitamin D  deficiency 12/07/2015   Severe obesity (BMI 35.0-35.9 with comorbidity) (HCC) 12/03/2015   Carpal tunnel syndrome on right 07/09/2015   Acquired hallux rigidus, left 12/30/2014   Acquired hallux rigidus, right 12/30/2014   Bilateral leg and foot pain 12/30/2014   MDD (major depressive disorder) 12/23/2014   Hyperlipidemia 10/01/2014    Past Surgical History:  Procedure Laterality Date   CARPAL TUNNEL RELEASE Right 2014   CHOLECYSTECTOMY  2004   COLONOSCOPY  2022   ROBOTIC ASSISTED TOTAL HYSTERECTOMY WITH BILATERAL SALPINGO OOPHERECTOMY N/A 06/14/2023   Procedure: XI ROBOTIC ASSISTED TOTAL HYSTERECTOMY WITH BILATERAL SALPINGO OOPHORECTOMY, PELVIC SENTINEL LYMPH NODE MAPPING AND BIOPSIES, POSSIBLE PELVIC/AORTIC NODE  SAMPLING, LAPAROTOMY;  Surgeon: Elby Webb Loges, MD;  Location: [facility] ORS;  Service: Gynecology;  Laterality: N/A;   TRIGGER FINGER RELEASE Right 2014   thumb    OB History     Gravida  1   Para  1   Term      Preterm  1   AB      Living  1      SAB      IAB      Ectopic      Multiple      Live Births               Home Medications    Prior to Admission medications    Medication Sig Start Date End Date Taking? Authorizing Provider  amoxicillin -clavulanate (AUGMENTIN ) 875-125 MG tablet Take 1 tablet by mouth every 12 (twelve) hours. 12/31/23  Yes Iola Lukes, FNP  Dextromethorphan-guaiFENesin  (MUCINEX  DM MAXIMUM STRENGTH) 60-1200 MG TB12 Take 1 tablet by mouth 2 (two) times daily. 12/31/23  Yes Karlissa Aron, Lukes, FNP  predniSONE  (STERAPRED UNI-PAK 21 TAB) 10 MG (21) TBPK tablet Take by mouth daily. Take 6 tabs by mouth daily  for 2 days, then 5 tabs for 2 days, then 4 tabs for 2 days, then 3 tabs for 2 days, 2 tabs for 2 days, then 1 tab by mouth daily for 2 days 12/31/23  Yes Iola Lukes, FNP  promethazine -dextromethorphan (PROMETHAZINE -DM) 6.25-15 MG/5ML syrup Take 10 mLs by mouth every 6 (six) hours as needed for cough. 12/31/23  Yes Iola Lukes, FNP  aspirin EC 81 MG tablet Take 81 mg by mouth daily. Swallow whole.    [provider]  b complex vitamins capsule Take 1 capsule by mouth daily.    [provider]  cholecalciferol (VITAMIN D3) 25 MCG (1000 UNIT) tablet Take 1,000 Units by mouth daily.    [provider]  citalopram  (CELEXA ) 10 MG tablet TAKE 1 TABLET BY MOUTH EVERY DAY 11/06/23   McDonough, Lauren K, PA-C  docusate sodium  (COLACE) 100 MG capsule Take 1 capsule (100 mg total) by mouth 2 (two) times daily as needed. 06/14/23   Connell Davies, MD  ferrous sulfate 325 (65 FE) MG tablet Take 325 mg by mouth every other day.    [provider]  ibuprofen  (ADVIL ) 600 MG tablet Take 1 tablet (600 mg total) by mouth every 6 (six) hours as needed. 06/14/23   Connell Davies, MD  Ipratropium-Albuterol  (COMBIVENT  RESPIMAT) 20-100 MCG/ACT AERS respimat Inhale 1 puff into the lungs every 6 (six) hours as needed for wheezing or shortness of breath. 12/31/23   Iola Lukes, FNP  loratadine (CLARITIN) 10 MG tablet Take 10 mg by mouth daily.    [provider]  Multiple Vitamins-Minerals (MULTIVITAMIN-MINERALS  PO) Take 1 tablet by mouth daily.    [provider]  nortriptyline (PAMELOR) 10 MG capsule Take 40 mg by mouth at bedtime.    [provider]  oxyCODONE -acetaminophen  (PERCOCET) 5-325 MG tablet Take 1 tablet by mouth every 4 (four) hours as needed for severe pain (pain score 7-10). 06/14/23 06/13/24  Connell Davies, MD  rizatriptan (MAXALT) 10 MG tablet Take 10 mg by mouth as needed for migraine. 01/03/23   [provider]  Simethicone  80 MG TABS Take 1 tablet (80 mg total) by mouth 4 (four) times daily as needed (gas, bloating). 06/14/23   Connell Davies, MD  triamcinolone  cream (KENALOG ) 0.1 % Apply 1 Application topically 2 (two) times daily. 09/15/23  McDonough, Lauren K, PA-C  vitamin E 180 MG (400 UNITS) capsule Take 400 Units by mouth daily.    [provider]  zinc gluconate 50 MG tablet Take 50 mg by mouth daily.    [provider]    Family History Family History  Problem Relation Age of Onset   Hypertension Mother    Heart disease Mother    Diabetes Mother    Glaucoma Mother    Obesity Father    Hypertension Father     Social History Social History   Tobacco Use   Smoking status: Former    Types: Cigarettes    Passive exposure: Never   Smokeless tobacco: Never  Vaping Use   Vaping status: Never Used  Substance Use Topics   Alcohol use: Yes    Comment: occassional   Drug use: Never     Allergies   Other, Latex, Pollen extract, Egg-derived products, and Wheat   Review of Systems Review of Systems  Constitutional:  Positive for fatigue. Negative for appetite change, chills, diaphoresis and fever.  HENT:  Negative for congestion, rhinorrhea and sore throat.   Respiratory:  Positive for cough, chest tightness, shortness of breath and wheezing.   Cardiovascular:  Negative for chest pain, palpitations and leg swelling.  Gastrointestinal:  Negative for nausea and vomiting.  Musculoskeletal:  Negative for myalgias.   Neurological:  Positive for headaches.  All other systems reviewed and are negative.    Physical Exam Triage Vital Signs ED Triage Vitals  Encounter Vitals Group     BP 12/31/23 1110 137/78     Girls Systolic BP Percentile --      Girls Diastolic BP Percentile --      Boys Systolic BP Percentile --      Boys Diastolic BP Percentile --      Pulse Rate 12/31/23 1110 92     Resp 12/31/23 1110 18     Temp 12/31/23 1110 97.9 F (36.6 C)     Temp src --      SpO2 12/31/23 1110 96 %     Weight --      Height --      Head Circumference --      Peak Flow --      Pain Score 12/31/23 1119 0     Pain Loc --      Pain Education --      Exclude from Growth Chart --    No data found.  Updated Vital Signs BP 137/78   Pulse 92   Temp 97.9 F (36.6 C)   Resp 18   SpO2 96%   Visual Acuity Right Eye Distance:   Left Eye Distance:   Bilateral Distance:    Right Eye Near:   Left Eye Near:    Bilateral Near:     Physical Exam Vitals reviewed.  Constitutional:      General: She is awake. She is not in acute distress.    Appearance: Normal appearance. She is well-developed. She is not ill-appearing, toxic-appearing or diaphoretic.  HENT:     Head: Normocephalic.     Right Ear: Tympanic membrane, ear canal and external ear normal. No drainage, swelling or tenderness. No middle ear effusion. Tympanic membrane is not erythematous.     Left Ear: Tympanic membrane, ear canal and external ear normal. No drainage, swelling or tenderness.  No middle ear effusion. Tympanic membrane is not erythematous.     Nose: Congestion and rhinorrhea present.  Mouth/Throat:     Lips: Pink.     Mouth: Mucous membranes are moist.     Pharynx: No pharyngeal swelling, oropharyngeal exudate, posterior oropharyngeal erythema or uvula swelling.     Tonsils: No tonsillar exudate or tonsillar abscesses.  Eyes:     General: Vision grossly intact.     Conjunctiva/sclera: Conjunctivae normal.   Cardiovascular:     Rate and Rhythm: Normal rate.     Heart sounds: Normal heart sounds.  Pulmonary:     Effort: Pulmonary effort is normal. No tachypnea or respiratory distress.     Breath sounds: Normal breath sounds and air entry. No decreased breath sounds, wheezing or rhonchi.  Musculoskeletal:        General: Normal range of motion.     Cervical back: Normal range of motion and neck supple.  Lymphadenopathy:     Cervical: No cervical adenopathy.  Skin:    General: Skin is warm and dry.  Neurological:     General: No focal deficit present.     Mental Status: She is alert and oriented to person, place, and time.     Sensory: Sensation is intact.     Motor: Motor function is intact.     Coordination: Coordination is intact.     Gait: Gait is intact.  Psychiatric:        Speech: Speech normal.        Behavior: Behavior is cooperative.      UC Treatments / Results  Labs (all labs ordered are listed, but only abnormal results are displayed) Labs Reviewed - No data to display  EKG   Radiology No results found.  Procedures Procedures (including critical care time)  Medications Ordered in UC Medications  ipratropium-albuterol  (DUONEB) 0.5-2.5 (3) MG/3ML nebulizer solution 3 mL (3 mLs Nebulization Given 12/31/23 1154)    Initial Impression / Assessment and Plan / UC Course  I have reviewed the triage vital signs and the nursing notes.  Pertinent labs & imaging results that were available during my care of the patient were reviewed by me and considered in my medical decision making (see chart for details).     Patient presents with a two-week history of dry cough and worsening wheezing, with limited relief from her inhaler. She also reports intermittent chest tightness and back pain, though denies active pain today. She notes easy fatigue. Lung exam reveals clear breath sounds without audible wheezing, and oxygen saturation is normal. Clinical presentation is  consistent with acute bronchitis with asthma exacerbation; pneumonia remains a differential diagnosis. A chest X-ray was ordered to evaluate for pneumonia, and a duonebulizer treatment was administered in-office. The patient was prescribed Augmentin  for 7 days, a prednisone  dose pack, and scheduled Mucinex  DM to be taken twice daily with the antibiotic. Promethazine  DM was prescribed as needed for cough, and her asthma inhaler was refilled. Supportive care measures, including increased fluid intake and use of a humidifier at night, were discussed. Patient will be contacted if the chest X-ray reveals abnormalities and may also view results through the patient portal. She was advised to follow up with her primary care provider if symptoms persist or worsen. Emergency care should be sought if she develops high fever, shortness of breath, chest pain, or signs of respiratory distress.  Today's evaluation has revealed no signs of a dangerous process. Discussed diagnosis with patient and/or guardian. Patient and/or guardian aware of their diagnosis, possible red flag symptoms to watch out for and need for close follow up.  Patient and/or guardian understands verbal and written discharge instructions. Patient and/or guardian comfortable with plan and disposition.  Patient and/or guardian has a clear mental status at this time, good insight into illness (after discussion and teaching) and has clear judgment to make decisions regarding their care  Documentation was completed with the aid of voice recognition software. Transcription may contain typographical errors. Final Clinical Impressions(s) / UC Diagnoses   Final diagnoses:  Acute cough  Acute bronchitis with asthma with acute exacerbation     Discharge Instructions      You were seen today for a persistent dry cough and wheezing lasting two weeks, with symptoms suggesting acute bronchitis and an asthma exacerbation. A chest X-ray was ordered to rule out  pneumonia, and you received a breathing treatment in the clinic to help open your airways. You were prescribed Augmentin  for seven days, a prednisone  dose pack to reduce inflammation, and Mucinex  DM to help loosen mucus. Promethazine  DM was prescribed for use as needed to manage your cough. Your asthma inhaler was also refilled. To support your recovery, drink plenty of fluids throughout the day to help thin mucus, and use a humidifier at night to keep your airways moist and reduce congestion. Rest as needed and avoid exposure to smoke, strong odors, or allergens that may worsen symptoms. You can view your chest X-ray results through your patient portal, and our office will contact you if anything abnormal is found. Follow up with your primary care provider if your symptoms do not begin to improve within a few days or continue beyond one week. Seek emergency care if you develop high fever, chest pain, shortness of breath, wheezing that does not improve with inhaler use, or any signs of respiratory distress.      ED Prescriptions     Medication Sig Dispense Auth. Provider   promethazine -dextromethorphan (PROMETHAZINE -DM) 6.25-15 MG/5ML syrup Take 10 mLs by mouth every 6 (six) hours as needed for cough. 118 mL Magdalene Tardiff, [place], FNP   predniSONE  (STERAPRED UNI-PAK 21 TAB) 10 MG (21) TBPK tablet Take by mouth daily. Take 6 tabs by mouth daily  for 2 days, then 5 tabs for 2 days, then 4 tabs for 2 days, then 3 tabs for 2 days, 2 tabs for 2 days, then 1 tab by mouth daily for 2 days 42 tablet Mikhayla Phillis, [place], FNP   Dextromethorphan-guaiFENesin  (MUCINEX  DM MAXIMUM STRENGTH) 60-1200 MG TB12 Take 1 tablet by mouth 2 (two) times daily. 20 tablet Iola Lukes, FNP   amoxicillin -clavulanate (AUGMENTIN ) 875-125 MG tablet Take 1 tablet by mouth every 12 (twelve) hours. 14 tablet Clella Mckeel, [place], FNP   Ipratropium-Albuterol  (COMBIVENT  RESPIMAT) 20-100 MCG/ACT AERS respimat Inhale 1 puff into the lungs every  6 (six) hours as needed for wheezing or shortness of breath. 4 g Iola Lukes, FNP      PDMP not reviewed this encounter.   Iola Lukes, [place] 12/31/23 1204

## 2023-12-31 NOTE — Discharge Instructions (Addendum)
 You were seen today for a persistent dry cough and wheezing lasting two weeks, with symptoms suggesting acute bronchitis and an asthma exacerbation. A chest X-ray was ordered to rule out pneumonia, and you received a breathing treatment in the clinic to help open your airways. You were prescribed Augmentin  for seven days, a prednisone  dose pack to reduce inflammation, and Mucinex  DM to help loosen mucus. Promethazine  DM was prescribed for use as needed to manage your cough. Your asthma inhaler was also refilled. To support your recovery, drink plenty of fluids throughout the day to help thin mucus, and use a humidifier at night to keep your airways moist and reduce congestion. Rest as needed and avoid exposure to smoke, strong odors, or allergens that may worsen symptoms. You can view your chest X-ray results through your patient portal, and our office will contact you if anything abnormal is found. Follow up with your primary care provider if your symptoms do not begin to improve within a few days or continue beyond one week. Seek emergency care if you develop high fever, chest pain, shortness of breath, wheezing that does not improve with inhaler use, or any signs of respiratory distress.

## 2023-12-31 NOTE — ED Triage Notes (Signed)
 Patient to Urgent Care with complaints of dry cough/ wheezing.  Symptoms x2 weeks. Feels worse at night. Concerned about pneumonia.   Increased usage of combivent  inhaler

## 2024-01-04 DIAGNOSIS — F411 Generalized anxiety disorder: Secondary | ICD-10-CM | POA: Diagnosis not present

## 2024-01-04 DIAGNOSIS — F4312 Post-traumatic stress disorder, chronic: Secondary | ICD-10-CM | POA: Diagnosis not present

## 2024-01-04 DIAGNOSIS — F330 Major depressive disorder, recurrent, mild: Secondary | ICD-10-CM | POA: Diagnosis not present

## 2024-01-08 DIAGNOSIS — F4312 Post-traumatic stress disorder, chronic: Secondary | ICD-10-CM | POA: Diagnosis not present

## 2024-01-08 DIAGNOSIS — F411 Generalized anxiety disorder: Secondary | ICD-10-CM | POA: Diagnosis not present

## 2024-01-08 DIAGNOSIS — F331 Major depressive disorder, recurrent, moderate: Secondary | ICD-10-CM | POA: Diagnosis not present

## 2024-01-16 ENCOUNTER — Telehealth: Payer: Self-pay

## 2024-01-16 NOTE — Telephone Encounter (Signed)
 Pt called that her husband diagnosis with MRSA she like check it as per alyssa is no way to check due she is not having any rash or other symptoms advised her to wash her hand frequently and stay clean

## 2024-01-17 ENCOUNTER — Encounter: Payer: Self-pay | Admitting: Obstetrics and Gynecology

## 2024-01-17 ENCOUNTER — Inpatient Hospital Stay: Payer: BC Managed Care – PPO | Attending: Obstetrics and Gynecology | Admitting: Obstetrics and Gynecology

## 2024-01-17 VITALS — BP 110/47 | HR 82 | Temp 98.6°F | Resp 20 | Wt 223.5 lb

## 2024-01-17 DIAGNOSIS — F411 Generalized anxiety disorder: Secondary | ICD-10-CM | POA: Diagnosis not present

## 2024-01-17 DIAGNOSIS — Z8542 Personal history of malignant neoplasm of other parts of uterus: Secondary | ICD-10-CM | POA: Diagnosis not present

## 2024-01-17 DIAGNOSIS — Z79899 Other long term (current) drug therapy: Secondary | ICD-10-CM | POA: Diagnosis not present

## 2024-01-17 DIAGNOSIS — C541 Malignant neoplasm of endometrium: Secondary | ICD-10-CM

## 2024-01-17 DIAGNOSIS — Z90710 Acquired absence of both cervix and uterus: Secondary | ICD-10-CM | POA: Insufficient documentation

## 2024-01-17 DIAGNOSIS — N941 Unspecified dyspareunia: Secondary | ICD-10-CM | POA: Insufficient documentation

## 2024-01-17 DIAGNOSIS — Z90722 Acquired absence of ovaries, bilateral: Secondary | ICD-10-CM | POA: Insufficient documentation

## 2024-01-17 DIAGNOSIS — Z87891 Personal history of nicotine dependence: Secondary | ICD-10-CM | POA: Diagnosis not present

## 2024-01-17 DIAGNOSIS — Z9079 Acquired absence of other genital organ(s): Secondary | ICD-10-CM | POA: Insufficient documentation

## 2024-01-17 NOTE — Progress Notes (Signed)
 Gynecologic Oncology Visit   Referring Provider: Dr. Starla   Chief Concern: Endometrial cancer stage IA, surveillance Subjective:  Natasha Vargas is a 53 y.o. female who is seen in consultation from Dr. Starla for endometrial adenocarcinoma.   Returns today for follow up. Only complaint is dyspareunia.  Hurts in vagina and pelvis.  This antedates the surgery in 1/25.    Oncology history Patient developed PMB and saw Dr Starla 04/25/23.  Patient is a 53 y.o. married G1P0101 for evaluation of PMB. She went through menopause around 4 years ago. 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 She was prescribed vaginal estrogen but told her primary care provider that she still had the dyspareunia. She was prescribed estrogen patch. She used this (no progesterone ) until prometrium  was prescribed 10/2022. She started PMB about 2 months ago. She was given 30 days of provera.   04/25/23 ENDOMETRIUM, BIOPSY: Endometrioid adenocarcinoma, FIGO grade 2. COMMENT: Histochemical stains reveal tumor cells are positive for ER, PR and show diffuse p16 staining.  P53 is wild-type.  Controls are adequate.      Underwent TLH/BSO and SLN biopsies 06/14/23 with Dr Elby at [facility].    FINAL DIAGNOSIS      1. 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      - MYOMETRIUM WITH ADENOMYOSIS.      - ENDOCERVIX WITH NABOTHIAN CYSTS.      - BILATERAL FALLOPIAN TUBES WITH FIMBRIATED END.      - LEFT PARATUBAL CYST.      - BILATERAL UNREMARKABLE OVARIES.       2. Lymph node, sentinel, biopsy, Right obturator :      - TWO LYMPH NODES NEGATIVE FOR MALIGNANCY (0/2).       3. Lymph node, sentinel, biopsy, Left external iliac :      - ONE LYMPH NODE NEGATIVE FOR MALIGNANCY (0/1).  TUMOR Histologic Type: Endometrioid carcinoma Histologic Grade: FIGO grade  1 Molecular Type: MMR Immunohistochemistry: Intact nuclear expression of MLH1, PMS2, MSH2, and MSH6 p53 Immunohistochemistry: Normal (wild-type) expression     Myometrial Invasion: Present, inner half (less than 50%) Percentage: 33% Uterine Serosa Involvement: Not identified Cervical Involvement: Not identified Other Tissue/Organ Involvement: Not identified Lymphatic and/or Vascular Invasion: Not identified Washings negative  Problem List:     Patient Active Problem List    Diagnosis Date Noted   Endometrial cancer (HCC) 05/03/2023   BMI 35.0-35.9,adult 05/12/2021   Chest tightness 05/12/2021   Dyspnea on exertion 05/12/2021   Chronic pain 05/19/2020   Sciatica 05/19/2020   Severe obesity (BMI >= 40) (HCC) 04/12/2019   Anxiety and depression 04/12/2019   Severe anxiety 04/12/2019   Positive colorectal cancer screening using Cologuard test 02/13/2019   Shoulder pain, right 12/03/2018   Scoliosis 12/01/2018   Trigger finger of right thumb 12/10/2015   Vitamin D  deficiency 12/07/2015   Severe obesity (BMI 35.0-35.9 with comorbidity) (HCC) 12/03/2015   Carpal tunnel syndrome on right 07/09/2015   Acquired hallux rigidus, left 12/30/2014   Acquired hallux rigidus, right 12/30/2014   Bilateral leg and foot pain 12/30/2014   MDD (major depressive disorder) 12/23/2014   Hyperlipidemia 10/01/2014      Past Medical History:     Past Medical History:  Diagnosis Date   Anxiety     Arthritis  Depression     Migraines            Past Surgical History:      Past Surgical History:  Procedure Laterality Date   CHOLECYSTECTOMY   2004          OB History:                  OB History  Gravida Para Term Preterm AB Living  1 1   1   1   SAB IAB Ectopic Multiple Live Births                      # Outcome Date GA Lbr Len/2nd Weight Sex Type Anes PTL Lv  1 Preterm                        Family History:      Family History  Problem Relation Age of Onset    Hypertension Mother     Heart disease Mother     Diabetes Mother     Glaucoma Mother     Obesity Father     Hypertension Father            Social History: Social History         Socioeconomic History   Marital status: Married      Spouse name: Not on file   Number of children: Not on file   Years of education: Not on file   Highest education level: Not on file  Occupational History   Not on file  Tobacco Use   Smoking status: Never      Passive exposure: Never   Smokeless tobacco: Never  Substance and Sexual Activity   Alcohol use: Not on file   Drug use: Never   Sexual activity: Yes      Birth control/protection: Surgical  Other Topics Concern   Not on file  Social History Narrative   Not on file    Social Determinants of Health        Financial Resource Strain: Not on file  Food Insecurity: Not on file  Transportation Needs: Not on file  Physical Activity: Not on file  Stress: Not on file  Social Connections: Unknown (10/17/2021)    Received from [facility], [facility]    Social Network     Social Network: Not on file  Intimate Partner Violence: Unknown (09/08/2021)    Received from [facility], [facility]    HITS     Physically Hurt: Not on file     Insult or Talk Down To: Not on file     Threaten Physical Harm: Not on file     Scream or Curse: Not on file      Allergies: Allergies       Allergies  Allergen Reactions   Other        Artifical Sweetners- Headaches   Omega-3 Other (See Comments)      Artifical Sweetners- Headaches  Artifical Sweetners- Headaches   Bee Pollen Other (See Comments)   Latex Hives and Itching      Skin peels    Other reaction(s): Unknown, Unknown  Skin peels   Skin peels   Skin peels   Skin peels   Skin peels   Skin peels   Skin peels   Skin peels    Skin peels   Skin peels   Skin peels   Skin peels    Skin  peels   Skin peels    Skin peels     Skin peels  Skin peels  Skin peels  Skin peels      Skin peels  Skin peels     Other reaction(s): Unknown, Unknown Skin peels  Skin peels  Skin peels  Skin peels  Skin peels  Skin peels  Skin peels  Skin peels     Skin peels   Pollen Extract     Egg-Derived Products Other (See Comments)      headache   headache  headache   headache headache   Wheat Hives and Other (See Comments)      Other reaction(s): Unknown        Current Medications:       Current Outpatient Medications  Medication Sig Dispense Refill   b complex vitamins capsule Take 1 capsule by mouth daily.       buPROPion (WELLBUTRIN XL) 300 MG 24 hr tablet         busPIRone (BUSPAR) 15 MG tablet Take by mouth.       citalopram  (CELEXA ) 10 MG tablet TAKE 1 TABLET BY MOUTH EVERY DAY 90 tablet 0   estradiol  (ESTRACE ) 0.1 MG/GM vaginal cream Use 1 g vaginally every night for 2 weeks then two to three times per week       Ipratropium-Albuterol  (COMBIVENT  RESPIMAT) 20-100 MCG/ACT AERS respimat Take 2 puffs 3 x day for asthma 4 g 3   Multiple Vitamins-Minerals (MULTIVITAMIN-MINERALS PO) Take by mouth.       nortriptyline (PAMELOR) 10 MG capsule Take 40 mg by mouth at bedtime.       rizatriptan (MAXALT) 10 MG tablet take 10 mg at headache onset. Take another tablet in 2 hours if needed. Take no more than 2 tablets in a 24 hour period.       SUMAtriptan (IMITREX) 100 MG tablet Take one tablet by mouth for migraine.  Repeat in 2 hrs if not effective. Max dose of 2 doses in 24hrs.       tiZANidine (ZANAFLEX) 4 MG tablet Take by mouth.       VITAMIN D , CHOLECALCIFEROL, PO Take by mouth.       betamethasone dipropionate 0.05 % cream Apply topically.       fish oil-omega-3 fatty acids 1000 MG capsule Take by mouth.       progesterone  (PROMETRIUM ) 100 MG capsule Take by mouth.          No current facility-administered medications for this visit.        Review of Systems General: negative for, fevers, chills, fatigue, changes in sleep, changes in weight or appetite Skin:  negative for changes in color, texture, moles or lesions Eyes: negative for, changes in pain, diplopia.   HEENT: negative for, change in hearing, pain, discharge, tinnitus, vertigo, voice changes, sore throat, neck masses Pulmonary: negative for, dyspnea, orthopnea, productive cough Cardiac: negative for, palpitations, syncope, pain, discomfort, pressure.  Gastrointestinal: negative for, dysphagia, nausea, vomiting, jaundice, pain, constipation, diarrhea, hematemesis, hematochezia Genitourinary/Sexual: negative for, dysuria, discharge, hesitancy, nocturia, retention, stones, infections, STD's, incontinence Musculoskeletal: negative for, pain, stiffness, swelling, range of motion limitation.   Hematology: negative for, easy bruising, bleeding Neurologic/Psych: negative for, seizures, paralysis, weakness, tremor, change in gait, change in sensation, mood swings, depression, anxiety, change in memory.    Objective:  Physical Examination:   BMI 40.53 kg/m    Vitals:   01/17/24 1435  BP: (!) 110/47  Pulse: 82  Resp: 20  Temp: 98.6 F (37 C)  SpO2: 100%   ECOG Performance Status: 0, Asymptomatic   General appearance: alert, cooperative, and appears stated age HEENT:PERRLA and thyroid  without masses Lymph node survey: non-palpable, axillary, inguinal, supraclavicular Cardiovascular: regular rate and rhythm, no murmurs or gallops Respiratory: normal air entry, lungs clear to auscultation and no rales, rhonchi or wheezing Abdomen: soft, non-tender, without masses or organomegaly, nondistended, no hernias, and well healed incisions Back: inspection of back is normal Extremities: extremities normal, atraumatic, no cyanosis or edema Skin exam - normal coloration and turgor, no rashes, no suspicious skin lesions noted. Neurological exam reveals alert, oriented, normal speech, no focal findings or movement disorder noted.   Pelvic: exam chaperoned by nurse, EGBUS within normal limits, normal  vagina and vulva;  Bimanual; normal. Rectal: not indicated   Assessment:  Natasha Vargas is a 53 y.o. P41 female with PMB diagnosed with grade 2 endometrial adenocarcinoma on EMB.  Underwent Robotic TLH/BSO and SLN biopsies 06/14/23 with Dr Mariano at [facility]. Pathology showed Stage IA grade 1 endometrioid adenocarcinoma with mucinous differentiation, invading 33% of the uterine wall.  No LVSI, washings SLNs, adnexa and cervix.    NED   MMR Immunohistochemistry: Intact nuclear expression of MLH1, PMS2, MSH2, and MSH6 p53 Immunohistochemistry: Normal (wild-type) expression   ER/PR positive  Medical co-morbidities complicating care: Natasha Vargas  Plan:    Problem List Items Addressed This Visit              Genitourinary    Endometrial cancer (HCC) - Primary    Discussed that the risk of cancer recurrence is very low (<5%) and that adjuvant therapy was not indicated.   Suggested return to clinic to see us  in 6 months since Dr Connell left the area.  Patient will see Tinnie Dawn, NP for dyspareunia.   No obvious cause on exam and this complaint was present before uterine cancer surgery.      The patient's diagnosis, an outline of the further diagnostic and laboratory studies which will be required, the recommendation, and alternatives were discussed.  All questions were answered to the patient's satisfaction.   Prentice Agent, MD   CC:  Starla Harland BROCKS, MD [address] [place],  [place] [address] [phone]

## 2024-01-22 DIAGNOSIS — F4312 Post-traumatic stress disorder, chronic: Secondary | ICD-10-CM | POA: Diagnosis not present

## 2024-01-22 DIAGNOSIS — F330 Major depressive disorder, recurrent, mild: Secondary | ICD-10-CM | POA: Diagnosis not present

## 2024-01-22 DIAGNOSIS — F411 Generalized anxiety disorder: Secondary | ICD-10-CM | POA: Diagnosis not present

## 2024-01-25 ENCOUNTER — Other Ambulatory Visit: Payer: Self-pay | Admitting: Physician Assistant

## 2024-01-25 DIAGNOSIS — L309 Dermatitis, unspecified: Secondary | ICD-10-CM

## 2024-02-02 ENCOUNTER — Inpatient Hospital Stay (HOSPITAL_BASED_OUTPATIENT_CLINIC_OR_DEPARTMENT_OTHER): Admitting: Nurse Practitioner

## 2024-02-02 ENCOUNTER — Encounter: Payer: Self-pay | Admitting: Nurse Practitioner

## 2024-02-02 VITALS — BP 138/69 | HR 85 | Temp 98.4°F | Resp 17 | Wt 224.0 lb

## 2024-02-02 DIAGNOSIS — N941 Unspecified dyspareunia: Secondary | ICD-10-CM | POA: Diagnosis not present

## 2024-02-02 DIAGNOSIS — Z79899 Other long term (current) drug therapy: Secondary | ICD-10-CM | POA: Diagnosis not present

## 2024-02-02 DIAGNOSIS — Z90722 Acquired absence of ovaries, bilateral: Secondary | ICD-10-CM | POA: Diagnosis not present

## 2024-02-02 DIAGNOSIS — Z8542 Personal history of malignant neoplasm of other parts of uterus: Secondary | ICD-10-CM | POA: Diagnosis not present

## 2024-02-02 DIAGNOSIS — Z87891 Personal history of nicotine dependence: Secondary | ICD-10-CM | POA: Diagnosis not present

## 2024-02-02 DIAGNOSIS — Z90710 Acquired absence of both cervix and uterus: Secondary | ICD-10-CM | POA: Diagnosis not present

## 2024-02-02 DIAGNOSIS — Z9079 Acquired absence of other genital organ(s): Secondary | ICD-10-CM | POA: Diagnosis not present

## 2024-02-02 MED ORDER — DIAZEPAM 5 MG PO TABS: ORAL_TABLET | ORAL | 0 refills | Status: AC

## 2024-02-02 NOTE — Progress Notes (Signed)
 Symptom Management Clinic  [facility] Department of the [place]. [facility] [address] [place], [place] [address] [phone] (phone) [phone] (fax)  Patient Care Team: Fernand Sigrid HERO, MD as PCP - General (Internal Medicine)   Name of the patient: Natasha Vargas  [id number]  September 18, 1970   Date of visit: 02/02/24  Diagnosis- Endometrial cancer  Chief complaint/ Reason for visit- Dyspareunia  Heme/Onc history:  Oncology History   No history exists.    Interval history- Natasha Vargas is a 53 y.o. female with history of endometrial cancer, currently on surveillance, who presents to symptom management clinic for complaints of dyspareunia. Symptoms pre-date her cancer diagnosis and present since menopause. She has been with her partner for 40 + years and symptoms started after menopause. She previously used estrogen without improvement in symptoms. She was told by gynecologist previously that she had a 'shrinking vagina'. She is able to achieve penile insertion but feels like he hits a wall which is uncomfortable for her and her partner. She has tried vaginal estrogen without improvement as well as vaginal lubricants. She has not tried dilators. Denies bleeding or discharge.   Review of systems- Review of Systems  Constitutional:  Negative for malaise/fatigue and weight loss.  Gastrointestinal:  Negative for abdominal pain, constipation and diarrhea.  Genitourinary:  Negative for dysuria, frequency and urgency.  Psychiatric/Behavioral:  Negative for depression. The patient is not nervous/anxious.      Allergies  Allergen Reactions   Other     Artifical Sweetners- Headaches   Latex Hives and Itching    Skin peels   Burlap causes skin irritation    Pollen Extract    Egg-Derived Products Other (See Comments)    headache   Wheat Hives and Other (See Comments)    Migraines     Past Medical History:  Diagnosis  Date   Anxiety    Arthritis    Atrophic vaginitis    Carpal tunnel syndrome on right 2014   Depression    Dyspnea on exertion    Endometrial adenocarcinoma (HCC) 05/2023   Exercise-induced asthma    GERD (gastroesophageal reflux disease)    Hyperlipidemia    Iron deficiency    Migraines    Pneumonia    PONV (postoperative nausea and vomiting)    Preeclampsia 1990   seizure   Severe obesity (BMI >= 40) (HCC)    Vitamin B12 deficiency    Vitamin D  deficiency     Past Surgical History:  Procedure Laterality Date   CARPAL TUNNEL RELEASE Right 2014   CHOLECYSTECTOMY  2004   COLONOSCOPY  2022   ROBOTIC ASSISTED TOTAL HYSTERECTOMY WITH BILATERAL SALPINGO OOPHERECTOMY N/A 06/14/2023   Procedure: XI ROBOTIC ASSISTED TOTAL HYSTERECTOMY WITH BILATERAL SALPINGO OOPHORECTOMY, PELVIC SENTINEL LYMPH NODE MAPPING AND BIOPSIES, POSSIBLE PELVIC/AORTIC NODE SAMPLING, LAPAROTOMY;  Surgeon: Elby Webb Loges, MD;  Location: [facility] ORS;  Service: Gynecology;  Laterality: N/A;   TRIGGER FINGER RELEASE Right 2014   thumb    Social History   Socioeconomic History   Marital status: Married    Spouse name: Adriana   Number of children: 1   Years of education: Not on file   Highest education level: Not on file  Occupational History   Not on file  Tobacco Use   Smoking status: Former    Types: Cigarettes    Passive exposure: Never   Smokeless tobacco: Never  Vaping Use   Vaping status:  Never Used  Substance and Sexual Activity   Alcohol use: Yes    Comment: occassional   Drug use: Never   Sexual activity: Yes  Other Topics Concern   Not on file  Social History Narrative   Not on file   Social Drivers of Health   Financial Resource Strain: Not on file  Food Insecurity: Not on file  Transportation Needs: Not on file  Physical Activity: Not on file  Stress: Not on file  Social Connections: Unknown (10/17/2021)   Received [facility]   Social Network    Social Network: Not  on file  Intimate Partner Violence: Unknown (09/08/2021)   Received from [facility]   HITS    Physically Hurt: Not on file    Insult or Talk Down To: Not on file    Threaten Physical Harm: Not on file    Scream or Curse: Not on file    Family History  Problem Relation Age of Onset   Hypertension Mother    Heart disease Mother    Diabetes Mother    Glaucoma Mother    Obesity Father    Hypertension Father      Current Outpatient Medications:    aspirin EC 81 MG tablet, Take 81 mg by mouth daily. Swallow whole., Disp: , Rfl:    b complex vitamins capsule, Take 1 capsule by mouth daily., Disp: , Rfl:    betamethasone dipropionate 0.05 % cream, Apply topically., Disp: , Rfl:    cholecalciferol (VITAMIN D3) 25 MCG (1000 UNIT) tablet, Take 1,000 Units by mouth daily., Disp: , Rfl:    citalopram  (CELEXA ) 10 MG tablet, TAKE 1 TABLET BY MOUTH EVERY DAY (Patient taking differently: Take 40 mg by mouth daily.), Disp: 90 tablet, Rfl: 0   citalopram  (CELEXA ) 20 MG tablet, Take 20 mg by mouth every morning., Disp: , Rfl:    cyanocobalamin  (VITAMIN B12) 1000 MCG/ML injection, , Disp: , Rfl:    Dextromethorphan-guaiFENesin  (MUCINEX  DM MAXIMUM STRENGTH) 60-1200 MG TB12, Take 1 tablet by mouth 2 (two) times daily., Disp: 20 tablet, Rfl: 0   docusate sodium  (COLACE) 100 MG capsule, Take 1 capsule (100 mg total) by mouth 2 (two) times daily as needed., Disp: 30 capsule, Rfl: 2   ferrous sulfate 325 (65 FE) MG tablet, Take 325 mg by mouth every other day., Disp: , Rfl:    ibuprofen  (ADVIL ) 600 MG tablet, Take 1 tablet (600 mg total) by mouth every 6 (six) hours as needed., Disp: 60 tablet, Rfl: 1   Ipratropium-Albuterol  (COMBIVENT  RESPIMAT) 20-100 MCG/ACT AERS respimat, Inhale 1 puff into the lungs every 6 (six) hours as needed for wheezing or shortness of breath., Disp: 4 g, Rfl: 0   loratadine (CLARITIN) 10 MG tablet, Take 10 mg by mouth daily., Disp: , Rfl:    Magnesium Glycinate 100 MG CAPS, Take 1  capsule by mouth at bedtime., Disp: , Rfl:    Multiple Vitamin (MULTI-VITAMIN) tablet, Take 1 tablet by mouth daily., Disp: , Rfl:    Multiple Vitamins-Minerals (MULTIVITAMIN-MINERALS PO), Take 1 tablet by mouth daily., Disp: , Rfl:    nortriptyline (PAMELOR) 10 MG capsule, Take 40 mg by mouth at bedtime. (Patient taking differently: Take 50 mg by mouth at bedtime.), Disp: , Rfl:    Omega-3 1000 MG CAPS, Take by mouth., Disp: , Rfl:    promethazine -dextromethorphan (PROMETHAZINE -DM) 6.25-15 MG/5ML syrup, Take 10 mLs by mouth every 6 (six) hours as needed for cough., Disp: 118 mL, Rfl: 0  rizatriptan (MAXALT) 10 MG tablet, Take 10 mg by mouth as needed for migraine., Disp: , Rfl:    Simethicone  80 MG TABS, Take 1 tablet (80 mg total) by mouth 4 (four) times daily as needed (gas, bloating)., Disp: 30 tablet, Rfl: 1   triamcinolone  cream (KENALOG ) 0.1 %, APPLY TO AFFECTED AREA TWICE A DAY, Disp: 30 g, Rfl: 0   vitamin E 180 MG (400 UNITS) capsule, Take 400 Units by mouth daily., Disp: , Rfl:    zinc gluconate 50 MG tablet, Take 50 mg by mouth daily., Disp: , Rfl:    amoxicillin -clavulanate (AUGMENTIN ) 875-125 MG tablet, Take 1 tablet by mouth every 12 (twelve) hours. (Patient not taking: Reported on 02/02/2024), Disp: 14 tablet, Rfl: 0   oxyCODONE -acetaminophen  (PERCOCET) 5-325 MG tablet, Take 1 tablet by mouth every 4 (four) hours as needed for severe pain (pain score 7-10). (Patient not taking: Reported on 02/02/2024), Disp: 20 tablet, Rfl: 0   predniSONE  (STERAPRED UNI-PAK 21 TAB) 10 MG (21) TBPK tablet, Take by mouth daily. Take 6 tabs by mouth daily  for 2 days, then 5 tabs for 2 days, then 4 tabs for 2 days, then 3 tabs for 2 days, 2 tabs for 2 days, then 1 tab by mouth daily for 2 days, Disp: 42 tablet, Rfl: 0  Physical exam:  Vitals:   02/02/24 1526  BP: 138/69  Pulse: 85  Resp: 17  Temp: 98.4 F (36.9 C)  TempSrc: Tympanic  SpO2: 98%  Weight: 224 lb (101.6 kg)   Physical Exam Vitals  reviewed.  Constitutional:      Appearance: She is obese.  Pulmonary:     Effort: No respiratory distress.  Neurological:     Mental Status: She is alert and oriented to person, place, and time.  Psychiatric:        Mood and Affect: Mood normal.        Behavior: Behavior normal.   Pelvic exam deferred- reviewed Gyn ONc note  No results found.  Assessment and plan- Patient is a 53 y.o. female with history of endometrial cancer who presents to clinic for concerns of:   Dyspareunia- subjectively question vaginal length change vs pelvic floor muscle spasm. Symptoms pre-date hysterectomy so surgical vaginal length changes less likely however,post menopausal length may cause decrease. Previously no improvement with vaginal estrogen. Recent pelvic exam with gyn onc did not report structural concerns or spasms. Recommend trial vaginal muscle relaxant and evaluation with pelvic floor PT. Provided vaginal dilator today to perform exercises. Prescribed vaginal valium  5mg  to use 45 minutes prior to intercourse or dilator exercise. Apply few drops of water to tablet then insert high into vagina. Referral to pelvic floor PT. Can do self massage of pelvic floor including pelvic wand. Provided ehr with medium dilator today and instructions for use.  Endometrial cancer- s/p TLH BSo without adjuvant treatment. No prior history of radiation. Follow up with gyn onc as scheduled for surveillance.   Disposition; Ref to pelvic PT 3 mo- f/u with me- la   Visit Diagnosis 1. Dyspareunia in female    Patient expressed understanding and was in agreement with this plan. She also understands that She can call clinic at any time with any questions, concerns, or complaints.   Thank you for allowing me to participate in the care of this very pleasant patient.   Tinnie Dawn, DNP, AGNP-C, AOCNP [facility] [phone]  CC: Dr Mancil

## 2024-02-02 NOTE — Progress Notes (Signed)
 Patient here for onc follow-up appointment, no new concerns at this time.

## 2024-02-09 DIAGNOSIS — F411 Generalized anxiety disorder: Secondary | ICD-10-CM | POA: Diagnosis not present

## 2024-02-09 DIAGNOSIS — F331 Major depressive disorder, recurrent, moderate: Secondary | ICD-10-CM | POA: Diagnosis not present

## 2024-02-09 DIAGNOSIS — F4312 Post-traumatic stress disorder, chronic: Secondary | ICD-10-CM | POA: Diagnosis not present

## 2024-03-04 DIAGNOSIS — F411 Generalized anxiety disorder: Secondary | ICD-10-CM | POA: Diagnosis not present

## 2024-03-04 DIAGNOSIS — F4312 Post-traumatic stress disorder, chronic: Secondary | ICD-10-CM | POA: Diagnosis not present

## 2024-03-04 DIAGNOSIS — F330 Major depressive disorder, recurrent, mild: Secondary | ICD-10-CM | POA: Diagnosis not present

## 2024-03-08 DIAGNOSIS — F331 Major depressive disorder, recurrent, moderate: Secondary | ICD-10-CM | POA: Diagnosis not present

## 2024-03-08 DIAGNOSIS — F4312 Post-traumatic stress disorder, chronic: Secondary | ICD-10-CM | POA: Diagnosis not present

## 2024-03-08 DIAGNOSIS — F411 Generalized anxiety disorder: Secondary | ICD-10-CM | POA: Diagnosis not present

## 2024-03-12 ENCOUNTER — Telehealth: Payer: Self-pay | Admitting: Nurse Practitioner

## 2024-03-12 NOTE — Telephone Encounter (Signed)
 Voicemail left for patient to call back to reschedule appointment with Lauren due to provider being out of office.

## 2024-03-22 ENCOUNTER — Encounter: Payer: Self-pay | Admitting: Internal Medicine

## 2024-03-22 ENCOUNTER — Ambulatory Visit: Admitting: Internal Medicine

## 2024-03-22 VITALS — BP 124/66 | HR 89 | Temp 96.2°F | Resp 16 | Ht 61.0 in | Wt 224.4 lb

## 2024-03-22 DIAGNOSIS — R059 Cough, unspecified: Secondary | ICD-10-CM | POA: Diagnosis not present

## 2024-03-22 DIAGNOSIS — F411 Generalized anxiety disorder: Secondary | ICD-10-CM | POA: Diagnosis not present

## 2024-03-22 DIAGNOSIS — J45909 Unspecified asthma, uncomplicated: Secondary | ICD-10-CM

## 2024-03-22 MED ORDER — METHYLPREDNISOLONE ACETATE 80 MG/ML IJ SUSP
80.0000 mg | Freq: Once | INTRAMUSCULAR | Status: AC
  Administered 2024-03-22: 60 mg via INTRAMUSCULAR

## 2024-03-22 MED ORDER — IPRATROPIUM-ALBUTEROL 0.5-2.5 (3) MG/3ML IN SOLN
3.0000 mL | Freq: Once | RESPIRATORY_TRACT | Status: AC
  Administered 2024-03-22: 3 mL via RESPIRATORY_TRACT

## 2024-03-22 MED ORDER — AZITHROMYCIN 250 MG PO TABS: ORAL_TABLET | ORAL | 0 refills | Status: DC

## 2024-03-22 NOTE — Progress Notes (Unsigned)
 [facility] [address] [place], [place] [address]  Internal MEDICINE  Office Visit Note  Patient Name: Natasha Vargas  [id number]  [id number]  Date of Service: 03/25/2024  Chief Complaint  Patient presents with   Acute Visit    Cough not getting better    HPI Patient was seen in urgent care for ongoing cough and congestion 1.  She is complaining of ongoing symptoms and no relief after taking antibiotics and steroid Dosepak 2.  She is also on cough medication, she has not seen any improvement    Current Medication: Outpatient Encounter Medications as of 03/22/2024  Medication Sig   azithromycin (ZITHROMAX) 250 MG tablet Take one tab a day for 10 days for uri   aspirin EC 81 MG tablet Take 81 mg by mouth daily. Swallow whole.   b complex vitamins capsule Take 1 capsule by mouth daily.   betamethasone dipropionate 0.05 % cream Apply topically.   cholecalciferol (VITAMIN D3) 25 MCG (1000 UNIT) tablet Take 1,000 Units by mouth daily.   citalopram  (CELEXA ) 40 MG tablet Take one tab po every day   cyanocobalamin  (VITAMIN B12) 1000 MCG/ML injection    diazepam  (VALIUM ) 5 MG tablet Apply few drops of water to tablet (5 mg) then insert high into vagina 45 minutes prior to intercourse or dilator exercise.   docusate sodium  (COLACE) 100 MG capsule Take 1 capsule (100 mg total) by mouth 2 (two) times daily as needed.   ferrous sulfate 325 (65 FE) MG tablet Take 325 mg by mouth every other day.   ibuprofen  (ADVIL ) 600 MG tablet Take 1 tablet (600 mg total) by mouth every 6 (six) hours as needed.   Ipratropium-Albuterol  (COMBIVENT  RESPIMAT) 20-100 MCG/ACT AERS respimat Inhale 1 puff into the lungs every 6 (six) hours as needed for wheezing or shortness of breath.   loratadine (CLARITIN) 10 MG tablet Take 10 mg by mouth daily.   Magnesium Glycinate 100 MG CAPS Take 1 capsule by mouth at bedtime.   Multiple Vitamin (MULTI-VITAMIN) tablet Take 1 tablet by mouth daily.    Multiple Vitamins-Minerals (MULTIVITAMIN-MINERALS PO) Take 1 tablet by mouth daily.   nortriptyline (PAMELOR) 10 MG capsule Take 40 mg by mouth at bedtime. (Patient taking differently: Take 50 mg by mouth at bedtime.)   Omega-3 1000 MG CAPS Take by mouth.   predniSONE  (STERAPRED UNI-PAK 21 TAB) 10 MG (21) TBPK tablet Take by mouth daily. Take 6 tabs by mouth daily  for 2 days, then 5 tabs for 2 days, then 4 tabs for 2 days, then 3 tabs for 2 days, 2 tabs for 2 days, then 1 tab by mouth daily for 2 days   promethazine -dextromethorphan (PROMETHAZINE -DM) 6.25-15 MG/5ML syrup Take 10 mLs by mouth every 6 (six) hours as needed for cough.   rizatriptan (MAXALT) 10 MG tablet Take 10 mg by mouth as needed for migraine.   Simethicone  80 MG TABS Take 1 tablet (80 mg total) by mouth 4 (four) times daily as needed (gas, bloating).   triamcinolone  cream (KENALOG ) 0.1 % APPLY TO AFFECTED AREA TWICE A DAY   vitamin E 180 MG (400 UNITS) capsule Take 400 Units by mouth daily.   zinc gluconate 50 MG tablet Take 50 mg by mouth daily.   [DISCONTINUED] amoxicillin -clavulanate (AUGMENTIN ) 875-125 MG tablet Take 1 tablet by mouth every 12 (twelve) hours. (Patient not taking: Reported on 02/02/2024)   [DISCONTINUED] citalopram  (CELEXA ) 10 MG tablet TAKE 1 TABLET BY MOUTH EVERY DAY (Patient taking  differently: Take 40 mg by mouth daily.)   [DISCONTINUED] citalopram  (CELEXA ) 20 MG tablet Take 20 mg by mouth every morning.   [DISCONTINUED] Dextromethorphan-guaiFENesin  (MUCINEX  DM MAXIMUM STRENGTH) 60-1200 MG TB12 Take 1 tablet by mouth 2 (two) times daily.   [DISCONTINUED] oxyCODONE -acetaminophen  (PERCOCET) 5-325 MG tablet Take 1 tablet by mouth every 4 (four) hours as needed for severe pain (pain score 7-10). (Patient not taking: Reported on 02/02/2024)   [EXPIRED] ipratropium-albuterol  (DUONEB) 0.5-2.5 (3) MG/3ML nebulizer solution 3 mL    [EXPIRED] methylPREDNISolone acetate (DEPO-MEDROL) injection 80 mg    No  facility-administered encounter medications on file as of 03/22/2024.    Surgical History: Past Surgical History:  Procedure Laterality Date   CARPAL TUNNEL RELEASE Right 2014   CHOLECYSTECTOMY  2004   COLONOSCOPY  2022   ROBOTIC ASSISTED TOTAL HYSTERECTOMY WITH BILATERAL SALPINGO OOPHERECTOMY N/A 06/14/2023   Procedure: XI ROBOTIC ASSISTED TOTAL HYSTERECTOMY WITH BILATERAL SALPINGO OOPHORECTOMY, PELVIC SENTINEL LYMPH NODE MAPPING AND BIOPSIES, POSSIBLE PELVIC/AORTIC NODE SAMPLING, LAPAROTOMY;  Surgeon: Elby Webb Loges, MD;  Location: [facility] ORS;  Service: Gynecology;  Laterality: N/A;   TRIGGER FINGER RELEASE Right 2014   thumb    Medical History: Past Medical History:  Diagnosis Date   Anxiety    Arthritis    Atrophic vaginitis    Carpal tunnel syndrome on right 2014   Depression    Dyspnea on exertion    Endometrial adenocarcinoma (HCC) 05/2023   Exercise-induced asthma    GERD (gastroesophageal reflux disease)    Hyperlipidemia    Iron deficiency    Migraines    Pneumonia    PONV (postoperative nausea and vomiting)    Preeclampsia 1990   seizure   Severe obesity (BMI >= 40) (HCC)    Vitamin B12 deficiency    Vitamin D  deficiency     Family History: Family History  Problem Relation Age of Onset   Hypertension Mother    Heart disease Mother    Diabetes Mother    Glaucoma Mother    Obesity Father    Hypertension Father     Social History   Socioeconomic History   Marital status: Married    Spouse name: Adriana   Number of children: 1   Years of education: Not on file   Highest education level: Not on file  Occupational History   Not on file  Tobacco Use   Smoking status: Former    Types: Cigarettes    Passive exposure: Never   Smokeless tobacco: Never  Vaping Use   Vaping status: Never Used  Substance and Sexual Activity   Alcohol use: Yes    Comment: occassional   Drug use: Never   Sexual activity: Yes  Other Topics Concern   Not on file   Social History Narrative   Not on file   Social Drivers of Health   Financial Resource Strain: Not on file  Food Insecurity: Not on file  Transportation Needs: Not on file  Physical Activity: Not on file  Stress: Not on file  Social Connections: Unknown (10/17/2021)   Received from [facility]   Social Network    Social Network: Not on file  Intimate Partner Violence: Unknown (09/08/2021)   Received from [facility]   HITS    Physically Hurt: Not on file    Insult or Talk Down To: Not on file    Threaten Physical Harm: Not on file    Scream or Curse: Not on file  Review of Systems  Constitutional:  Negative for fatigue and fever.  HENT:  Negative for congestion, mouth sores and postnasal drip.   Respiratory:  Positive for cough.   Cardiovascular:  Negative for chest pain.  Genitourinary:  Negative for flank pain.  Psychiatric/Behavioral: Negative.      Vital Signs: BP 124/66   Pulse 89   Temp (!) 96.2 F (35.7 C)   Resp 16   Ht 5' 1 (1.549 m)   Wt 224 lb 6.4 oz (101.8 kg)   SpO2 97%   BMI 42.40 kg/m    Physical Exam Constitutional:      Appearance: Normal appearance.  HENT:     Head: Normocephalic and atraumatic.     Nose: Nose normal.     Mouth/Throat:     Mouth: Mucous membranes are moist.     Pharynx: No posterior oropharyngeal erythema.  Eyes:     Extraocular Movements: Extraocular movements intact.     Pupils: Pupils are equal, round, and reactive to light.  Cardiovascular:     Pulses: Normal pulses.     Heart sounds: Normal heart sounds.  Pulmonary:     Breath sounds: Wheezing present.     Comments: Patient has reduced air entry bilaterally Neurological:     General: No focal deficit present.     Mental Status: She is alert.  Psychiatric:        Mood and Affect: Mood normal.        Behavior: Behavior normal.        Assessment/Plan: 1. Acute asthmatic bronchitis (Primary) Patient was given a respiratory nebulizer treatment  with improvement in her symptoms a sample of Breztri is also given to her, take 1 to 2 puffs twice a day - ipratropium-albuterol  (DUONEB) 0.5-2.5 (3) MG/3ML nebulizer solution 3 mL - methylPREDNISolone acetate (DEPO-MEDROL) injection 80 mg  2. GAD (generalized anxiety disorder) Patient is on Lexapro  General Counseling: Emylee verbalizes understanding of the findings of todays visit and agrees with plan of treatment. I have discussed any further diagnostic evaluation that may be needed or ordered today. We also reviewed her medications today. she has been encouraged to call the office with any questions or concerns that should arise related to todays visit.    No orders of the defined types were placed in this encounter.   Meds ordered this encounter  Medications   azithromycin (ZITHROMAX) 250 MG tablet    Sig: Take one tab a day for 10 days for uri    Dispense:  10 tablet    Refill:  0   ipratropium-albuterol  (DUONEB) 0.5-2.5 (3) MG/3ML nebulizer solution 3 mL   methylPREDNISolone acetate (DEPO-MEDROL) injection 80 mg   citalopram  (CELEXA ) 40 MG tablet    Sig: Take one tab po every day    Total time spent:25 Minutes Time spent includes review of chart, medications, test results, and follow up plan with the patient.   [place] Controlled Substance Database was reviewed by me.   Dr Joelle Roswell [facility] Internal [facility]

## 2024-03-25 DIAGNOSIS — F330 Major depressive disorder, recurrent, mild: Secondary | ICD-10-CM | POA: Diagnosis not present

## 2024-03-25 DIAGNOSIS — F4312 Post-traumatic stress disorder, chronic: Secondary | ICD-10-CM | POA: Diagnosis not present

## 2024-03-25 DIAGNOSIS — F411 Generalized anxiety disorder: Secondary | ICD-10-CM | POA: Diagnosis not present

## 2024-03-25 MED ORDER — CITALOPRAM HYDROBROMIDE 40 MG PO TABS: ORAL_TABLET | ORAL | Status: DC

## 2024-03-29 ENCOUNTER — Telehealth: Payer: Self-pay | Admitting: Nurse Practitioner

## 2024-03-29 NOTE — Telephone Encounter (Signed)
 Appointment rescheduled due to provider being out of office. Unable to reach patient. Mailed new appointment

## 2024-04-08 ENCOUNTER — Encounter: Payer: Self-pay | Admitting: Internal Medicine

## 2024-04-08 ENCOUNTER — Ambulatory Visit
Admission: RE | Admit: 2024-04-08 | Discharge: 2024-04-08 | Disposition: A | Discharge status: Home / Self Care | Attending: Internal Medicine | Admitting: Internal Medicine

## 2024-04-08 ENCOUNTER — Ambulatory Visit
Admission: RE | Admit: 2024-04-08 | Discharge: 2024-04-08 | Disposition: A | Discharge status: Home / Self Care | Source: Ambulatory Visit | Attending: Internal Medicine | Admitting: Internal Medicine

## 2024-04-08 ENCOUNTER — Other Ambulatory Visit: Payer: Self-pay | Admitting: Internal Medicine

## 2024-04-08 ENCOUNTER — Ambulatory Visit: Payer: Self-pay | Admitting: Internal Medicine

## 2024-04-08 DIAGNOSIS — R051 Acute cough: Secondary | ICD-10-CM

## 2024-04-08 DIAGNOSIS — R059 Cough, unspecified: Secondary | ICD-10-CM | POA: Diagnosis not present

## 2024-04-08 MED ORDER — HYDROCOD POLI-CHLORPHE POLI ER 10-8 MG/5ML PO SUER: ORAL | 0 refills | Status: DC

## 2024-04-08 NOTE — Progress Notes (Signed)
 Her CXR is normal, most likely she has asthmatic bronchitis which will take time, she can try the come medicine for a week if no improvement I will change her inhaler

## 2024-04-08 NOTE — Telephone Encounter (Signed)
-----   Message from [facility] sent at 04/08/2024  2:42 PM EST ----- Her CXR is normal, most likely she has asthmatic bronchitis which will take time, she can try the come medicine for a week if no improvement I will change her inhaler  ----- Message ----- From: Interface, Rad Results In Sent: 04/08/2024  12:55 PM EST To: Sigrid CHRISTELLA Bathe, MD

## 2024-04-08 NOTE — Telephone Encounter (Signed)
Spoke with patient regarding CXR results.

## 2024-04-11 DIAGNOSIS — F4312 Post-traumatic stress disorder, chronic: Secondary | ICD-10-CM | POA: Diagnosis not present

## 2024-04-11 DIAGNOSIS — F411 Generalized anxiety disorder: Secondary | ICD-10-CM | POA: Diagnosis not present

## 2024-04-11 DIAGNOSIS — F331 Major depressive disorder, recurrent, moderate: Secondary | ICD-10-CM | POA: Diagnosis not present

## 2024-04-14 ENCOUNTER — Encounter: Payer: Self-pay | Admitting: Internal Medicine

## 2024-04-15 ENCOUNTER — Encounter: Payer: Self-pay | Admitting: Internal Medicine

## 2024-04-15 ENCOUNTER — Other Ambulatory Visit: Payer: Self-pay | Admitting: Internal Medicine

## 2024-04-15 MED ORDER — HYDROCOD POLI-CHLORPHE POLI ER 10-8 MG/5ML PO SUER: ORAL | 0 refills | Status: DC

## 2024-04-19 DIAGNOSIS — F4312 Post-traumatic stress disorder, chronic: Secondary | ICD-10-CM | POA: Diagnosis not present

## 2024-04-19 DIAGNOSIS — F330 Major depressive disorder, recurrent, mild: Secondary | ICD-10-CM | POA: Diagnosis not present

## 2024-04-19 DIAGNOSIS — F411 Generalized anxiety disorder: Secondary | ICD-10-CM | POA: Diagnosis not present

## 2024-04-26 ENCOUNTER — Ambulatory Visit: Admitting: Nurse Practitioner

## 2024-04-29 ENCOUNTER — Inpatient Hospital Stay: Admitting: Nurse Practitioner

## 2024-05-18 DIAGNOSIS — F411 Generalized anxiety disorder: Secondary | ICD-10-CM | POA: Diagnosis not present

## 2024-05-18 DIAGNOSIS — F4312 Post-traumatic stress disorder, chronic: Secondary | ICD-10-CM | POA: Diagnosis not present

## 2024-05-18 DIAGNOSIS — F330 Major depressive disorder, recurrent, mild: Secondary | ICD-10-CM | POA: Diagnosis not present

## 2024-05-28 ENCOUNTER — Encounter: Admitting: Internal Medicine

## 2024-06-04 ENCOUNTER — Encounter: Payer: Self-pay | Admitting: Internal Medicine

## 2024-06-04 ENCOUNTER — Ambulatory Visit: Admitting: Internal Medicine

## 2024-06-04 ENCOUNTER — Telehealth: Payer: Self-pay | Admitting: Internal Medicine

## 2024-06-04 VITALS — BP 125/80 | HR 86 | Temp 98.0°F | Resp 16 | Ht 61.0 in | Wt 228.0 lb

## 2024-06-04 DIAGNOSIS — K219 Gastro-esophageal reflux disease without esophagitis: Secondary | ICD-10-CM

## 2024-06-04 DIAGNOSIS — R300 Dysuria: Secondary | ICD-10-CM | POA: Diagnosis not present

## 2024-06-04 DIAGNOSIS — E782 Mixed hyperlipidemia: Secondary | ICD-10-CM | POA: Diagnosis not present

## 2024-06-04 DIAGNOSIS — E2839 Other primary ovarian failure: Secondary | ICD-10-CM

## 2024-06-04 DIAGNOSIS — F3341 Major depressive disorder, recurrent, in partial remission: Secondary | ICD-10-CM

## 2024-06-04 DIAGNOSIS — Z6841 Body Mass Index (BMI) 40.0 and over, adult: Secondary | ICD-10-CM | POA: Diagnosis not present

## 2024-06-04 DIAGNOSIS — Z8542 Personal history of malignant neoplasm of other parts of uterus: Secondary | ICD-10-CM | POA: Diagnosis not present

## 2024-06-04 DIAGNOSIS — Z0001 Encounter for general adult medical examination with abnormal findings: Secondary | ICD-10-CM

## 2024-06-04 MED ORDER — OMEPRAZOLE 40 MG PO CPDR: 40.0000 mg | DELAYED_RELEASE_CAPSULE | Freq: Every day | ORAL | 1 refills | Status: AC

## 2024-06-04 MED ORDER — FLUOXETINE HCL 40 MG PO CAPS: 40.0000 mg | ORAL_CAPSULE | Freq: Every day | ORAL | 3 refills | Status: AC

## 2024-06-04 NOTE — Telephone Encounter (Signed)
 Request for cardiology notes faxed to Dr. Fransico; [phone]

## 2024-06-04 NOTE — Progress Notes (Signed)
 " [facility] [address] [place], [place] [address]  Internal MEDICINE  Office Visit Note  Patient Name: Natasha Vargas  [id number]  [id number]  Date of Service: 06/20/2024  Chief Complaint  Patient presents with   Annual Exam    HTN, GERD     HPI Pt is here for routine health maintenance examination Patient had episodes of postmenopausal bleeding after starting her on HRT due to atrophic vaginitis She saw OB/GYN and was diagnosed to have endometrioid adenocarcinoma FIGO  grade 2, she underwent hysterectomy and has done well She is also complaining of severe heartburn has not been taking anything History of elevated cholesterol in the past Patient also suffers from PTSD and has seen psychiatrist for it  Current Medication: Outpatient Encounter Medications as of 06/04/2024  Medication Sig   aspirin EC 81 MG tablet Take 81 mg by mouth daily. Swallow whole.   b complex vitamins capsule Take 1 capsule by mouth daily.   betamethasone dipropionate 0.05 % cream Apply topically.   cholecalciferol (VITAMIN D3) 25 MCG (1000 UNIT) tablet Take 1,000 Units by mouth daily.   cyanocobalamin  (VITAMIN B12) 1000 MCG/ML injection    diazepam  (VALIUM ) 5 MG tablet Apply few drops of water to tablet (5 mg) then insert high into vagina 45 minutes prior to intercourse or dilator exercise.   docusate sodium  (COLACE) 100 MG capsule Take 1 capsule (100 mg total) by mouth 2 (two) times daily as needed.   ferrous sulfate 325 (65 FE) MG tablet Take 325 mg by mouth every other day.   FLUoxetine  (PROZAC ) 40 MG capsule Take 1 capsule (40 mg total) by mouth daily.   ibuprofen  (ADVIL ) 600 MG tablet Take 1 tablet (600 mg total) by mouth every 6 (six) hours as needed.   Ipratropium-Albuterol  (COMBIVENT  RESPIMAT) 20-100 MCG/ACT AERS respimat Inhale 1 puff into the lungs every 6 (six) hours as needed for wheezing or shortness of breath.   loratadine (CLARITIN) 10 MG tablet Take 10 mg by mouth daily.    Magnesium Glycinate 100 MG CAPS Take 1 capsule by mouth at bedtime.   Multiple Vitamin (MULTI-VITAMIN) tablet Take 1 tablet by mouth daily.   Multiple Vitamins-Minerals (MULTIVITAMIN-MINERALS PO) Take 1 tablet by mouth daily.   nortriptyline (PAMELOR) 10 MG capsule Take 40 mg by mouth at bedtime. (Patient taking differently: Take 50 mg by mouth at bedtime.)   Omega-3 1000 MG CAPS Take by mouth.   omeprazole  (PRILOSEC) 40 MG capsule Take 1 capsule (40 mg total) by mouth daily.   rizatriptan (MAXALT) 10 MG tablet Take 10 mg by mouth as needed for migraine.   vitamin E 180 MG (400 UNITS) capsule Take 400 Units by mouth daily.   zinc gluconate 50 MG tablet Take 50 mg by mouth daily.   [DISCONTINUED] azithromycin  (ZITHROMAX ) 250 MG tablet Take one tab a day for 10 days for uri   [DISCONTINUED] chlorpheniramine-HYDROcodone (TUSSIONEX) 10-8 MG/5ML Take 5 cc every 12hr for cough   [DISCONTINUED] citalopram  (CELEXA ) 40 MG tablet Take one tab po every day   [DISCONTINUED] predniSONE  (STERAPRED UNI-PAK 21 TAB) 10 MG (21) TBPK tablet Take by mouth daily. Take 6 tabs by mouth daily  for 2 days, then 5 tabs for 2 days, then 4 tabs for 2 days, then 3 tabs for 2 days, 2 tabs for 2 days, then 1 tab by mouth daily for 2 days   [DISCONTINUED] promethazine -dextromethorphan (PROMETHAZINE -DM) 6.25-15 MG/5ML syrup Take 10 mLs by mouth every 6 (six) hours  as needed for cough.   [DISCONTINUED] Simethicone  80 MG TABS Take 1 tablet (80 mg total) by mouth 4 (four) times daily as needed (gas, bloating).   [DISCONTINUED] triamcinolone  cream (KENALOG ) 0.1 % APPLY TO AFFECTED AREA TWICE A DAY   No facility-administered encounter medications on file as of 06/04/2024.    Surgical History: Past Surgical History:  Procedure Laterality Date   CARPAL TUNNEL RELEASE Right 2014   CHOLECYSTECTOMY  2004   COLONOSCOPY  2022   ROBOTIC ASSISTED TOTAL HYSTERECTOMY WITH BILATERAL SALPINGO OOPHERECTOMY N/A 06/14/2023   Procedure: XI  ROBOTIC ASSISTED TOTAL HYSTERECTOMY WITH BILATERAL SALPINGO OOPHORECTOMY, PELVIC SENTINEL LYMPH NODE MAPPING AND BIOPSIES, POSSIBLE PELVIC/AORTIC NODE SAMPLING, LAPAROTOMY;  Surgeon: Elby Webb Loges, MD;  Location: [facility] ORS;  Service: Gynecology;  Laterality: N/A;   TRIGGER FINGER RELEASE Right 2014   thumb    Medical History: Past Medical History:  Diagnosis Date   Anxiety    Arthritis    Atrophic vaginitis    Carpal tunnel syndrome on right 2014   Depression    Dyspnea on exertion    Endometrial adenocarcinoma (HCC) 05/2023   Exercise-induced asthma    GERD (gastroesophageal reflux disease)    Hyperlipidemia    Iron deficiency    Migraines    Pneumonia    PONV (postoperative nausea and vomiting)    Preeclampsia 1990   seizure   Severe obesity (BMI >= 40) (HCC)    Vitamin B12 deficiency    Vitamin D  deficiency     Family History: Family History  Problem Relation Age of Onset   Hypertension Mother    Heart disease Mother    Diabetes Mother    Glaucoma Mother    Obesity Father    Hypertension Father     Social History: Social History   Socioeconomic History   Marital status: Married    Spouse name: Adriana   Number of children: 1   Years of education: Not on file   Highest education level: Not on file  Occupational History   Not on file  Tobacco Use   Smoking status: Former    Types: Cigarettes    Passive exposure: Never   Smokeless tobacco: Never  Vaping Use   Vaping status: Never Used  Substance and Sexual Activity   Alcohol use: Yes    Comment: occassional   Drug use: Never   Sexual activity: Yes  Other Topics Concern   Not on file  Social History Narrative   Not on file   Social Drivers of Health   Tobacco Use: Medium Risk (06/04/2024)   Patient History    Smoking Tobacco Use: Former    Smokeless Tobacco Use: Never    Passive Exposure: Never  Programmer, Applications: Not on [employer] Insecurity: Not on file  Transportation  Needs: Not on file  Physical Activity: Not on file  Stress: Not on file  Social Connections: Unknown (10/17/2021)   Received from [facility]   Social Network    Social Network: Not on file  Depression (PHQ2-9): Low Risk (02/02/2024)   Depression (PHQ2-9)    PHQ-2 Score: 0  Alcohol Screen: Low Risk (06/04/2024)   Alcohol Screen    Last Alcohol Screening Score (AUDIT): 2  Housing: Unknown (10/26/2023)   [facility] from [facility]   Epic    Unable to Pay for Housing in the Last Year: Not on file    Number of Times Moved in the Last Year: Not on file  At any time in the past 12 months, were you homeless or living in a shelter (including now)?: No  Utilities: Not on file  Health Literacy: Not on file      Review of Systems  Constitutional:  Negative for chills, fatigue and unexpected weight change.  HENT:  Positive for postnasal drip. Negative for congestion, rhinorrhea, sneezing and sore throat.   Eyes:  Negative for redness.  Respiratory:  Negative for cough, chest tightness and shortness of breath.   Cardiovascular:  Negative for chest pain and palpitations.  Gastrointestinal:  Negative for abdominal pain, constipation, diarrhea, nausea and vomiting.       Heartburn  Genitourinary:  Negative for dysuria and frequency.  Musculoskeletal:  Negative for arthralgias, back pain, joint swelling and neck pain.  Skin:  Negative for rash.  Neurological: Negative.  Negative for tremors and numbness.  Hematological:  Negative for adenopathy. Does not bruise/bleed easily.  Psychiatric/Behavioral:  Negative for behavioral problems (Depression), sleep disturbance and suicidal ideas. The patient is not nervous/anxious.      Vital Signs: BP 125/80   Pulse 86   Temp 98 F (36.7 C)   Resp 16   Ht 5' 1 (1.549 m)   Wt 228 lb (103.4 kg)   SpO2 99%   BMI 43.08 kg/m    Physical Exam Constitutional:      General: She is not in acute distress.    Appearance: She is  well-developed. She is not diaphoretic.  HENT:     Head: Normocephalic and atraumatic.     Right Ear: External ear normal.     Left Ear: External ear normal.     Nose: Nose normal.     Mouth/Throat:     Pharynx: No oropharyngeal exudate.  Eyes:     General: No scleral icterus.       Right eye: No discharge.        Left eye: No discharge.     Conjunctiva/sclera: Conjunctivae normal.     Pupils: Pupils are equal, round, and reactive to light.  Neck:     Thyroid : No thyromegaly.     Vascular: No JVD.     Trachea: No tracheal deviation.  Cardiovascular:     Rate and Rhythm: Normal rate and regular rhythm.     Heart sounds: Normal heart sounds. No murmur heard.    No friction rub. No gallop.  Pulmonary:     Effort: Pulmonary effort is normal. No respiratory distress.     Breath sounds: Normal breath sounds. No stridor. No wheezing or rales.  Chest:     Chest wall: No tenderness.  Abdominal:     General: Bowel sounds are normal. There is no distension.     Palpations: Abdomen is soft. There is no mass.     Tenderness: There is no abdominal tenderness. There is no guarding or rebound.  Musculoskeletal:        General: No tenderness or deformity. Normal range of motion.     Cervical back: Normal range of motion and neck supple.  Lymphadenopathy:     Cervical: No cervical adenopathy.  Skin:    General: Skin is warm and dry.     Coloration: Skin is not pale.     Findings: No erythema or rash.  Neurological:     Mental Status: She is alert.     Cranial Nerves: No cranial nerve deficit.     Motor: No abnormal muscle tone.     Coordination: Coordination normal.  Deep Tendon Reflexes: Reflexes are normal and symmetric.  Psychiatric:        Behavior: Behavior normal.        Thought Content: Thought content normal.        Judgment: Judgment normal.      LABS: Recent Results (from the past 2160 hours)  UA/M w/rflx Culture, Routine     Status: Abnormal   Collection Time:  06/04/24 11:33 AM   Specimen: Urine   Urine  Result Value Ref Range   Specific Gravity, UA 1.026 1.005 - 1.030   pH, UA 7.0 5.0 - 7.5   Color, UA Yellow Yellow   Appearance Ur Clear Clear   Leukocytes,UA Trace (A) Negative   Protein,UA Trace Negative/Trace   Glucose, UA Negative Negative   Ketones, UA Negative Negative   RBC, UA Negative Negative   Bilirubin, UA Negative Negative   Urobilinogen, Ur 1.0 0.2 - 1.0 mg/dL   Nitrite, UA Negative Negative   Microscopic Examination See below:     Comment: Microscopic was indicated and was performed.   Urinalysis Reflex Comment     Comment: This specimen has reflexed to a Urine Culture.  Microscopic Examination     Status: Abnormal   Collection Time: 06/04/24 11:33 AM   Urine  Result Value Ref Range   WBC, UA 0-5 0 - 5 /hpf   RBC, Urine 0-2 0 - 2 /hpf   Epithelial Cells (non renal) >10 (A) 0 - 10 /hpf   Casts None seen None seen /lpf   Bacteria, UA Few None seen/Few  Urine Culture, Reflex     Status: None   Collection Time: 06/04/24 11:33 AM   Urine  Result Value Ref Range   Urine Culture, Routine Final report    Organism ID, Bacteria Comment     Comment: Mixed urogenital flora Less than 10,000 colonies/mL   CBC with Differential/Platelet     Status: None   Collection Time: 06/14/24  8:43 AM  Result Value Ref Range   WBC 5.8 3.4 - 10.8 x10E3/uL   RBC 4.49 3.77 - 5.28 x10E6/uL   Hemoglobin 13.7 11.1 - 15.9 g/dL   Hematocrit 57.3 65.9 - 46.6 %   MCV 95 79 - 97 fL   MCH 30.5 26.6 - 33.0 pg   MCHC 32.2 31.5 - 35.7 g/dL   RDW 86.6 88.2 - 84.5 %   Platelets 329 150 - 450 x10E3/uL   Neutrophils 54 Not Estab. %   Lymphs 35 Not Estab. %   Monocytes 7 Not Estab. %   Eos 3 Not Estab. %   Basos 1 Not Estab. %   Neutrophils Absolute 3.2 1.4 - 7.0 x10E3/uL   Lymphocytes Absolute 2.0 0.7 - 3.1 x10E3/uL   Monocytes Absolute 0.4 0.1 - 0.9 x10E3/uL   EOS (ABSOLUTE) 0.2 0.0 - 0.4 x10E3/uL   Basophils Absolute 0.0 0.0 - 0.2 x10E3/uL    Immature Granulocytes 0 Not Estab. %   Immature Grans (Abs) 0.0 0.0 - 0.1 x10E3/uL  Lipid Panel With LDL/HDL Ratio     Status: Abnormal   Collection Time: 06/14/24  8:43 AM  Result Value Ref Range   Cholesterol, Total 260 (H) 100 - 199 mg/dL   Triglycerides 874 0 - 149 mg/dL   HDL 52 >60 mg/dL   VLDL Cholesterol Cal 23 5 - 40 mg/dL   LDL Chol Calc (NIH) 814 (H) 0 - 99 mg/dL   LDL/HDL Ratio 3.6 (H) 0.0 - 3.2 ratio    Comment:  LDL/HDL Ratio                                             Men  Women                               1/2 Avg.Risk  1.0    1.5                                   Avg.Risk  3.6    3.2                                2X Avg.Risk  6.2    5.0                                3X Avg.Risk  8.0    6.1   TSH     Status: None   Collection Time: 06/14/24  8:43 AM  Result Value Ref Range   TSH 1.710 0.450 - 4.500 uIU/mL  T4, free     Status: None   Collection Time: 06/14/24  8:43 AM  Result Value Ref Range   Free T4 1.37 0.82 - 1.77 ng/dL  Comprehensive metabolic panel with GFR     Status: Abnormal   Collection Time: 06/14/24  8:43 AM  Result Value Ref Range   Glucose 102 (H) 70 - 99 mg/dL   BUN 11 6 - 24 mg/dL   Creatinine, Ser 9.12 0.57 - 1.00 mg/dL   eGFR 78 >40 fO/fpw/8.26   BUN/Creatinine Ratio 13 9 - 23   Sodium 141 134 - 144 mmol/L   Potassium 4.9 3.5 - 5.2 mmol/L   Chloride 104 96 - 106 mmol/L   CO2 21 20 - 29 mmol/L   Calcium  9.5 8.7 - 10.2 mg/dL   Total Protein 7.2 6.0 - 8.5 g/dL   Albumin 4.5 3.8 - 4.9 g/dL   Globulin, Total 2.7 1.5 - 4.5 g/dL   Bilirubin Total 0.5 0.0 - 1.2 mg/dL   Alkaline Phosphatase 67 49 - 135 IU/L   AST 24 0 - 40 IU/L   ALT 34 (H) 0 - 32 IU/L       Assessment/Plan: 1. Encounter for general adult medical examination with abnormal findings (Primary) All preventive health maintenance is updated  2. Other primary ovarian failure Patient needs bone density will order 1 today - DG Bone Density;  Future  3. Mixed hyperlipidemia Patient has history of hyperlipidemia in the past we will repeat her levels and treat accordingly patient - Lipid Panel With LDL/HDL Ratio - TSH   4. History of uterine cancer Followed by OB/GYN Will monitor along as well  5. Gastroesophageal reflux disease without esophagitis This can be due to her recent weight gain after diagnosis of uterine cancer, will start on omeprazole  40 mg once a day. she might need further diagnostic or need to see GI symptoms are not resolved - omeprazole  (PRILOSEC) 40 MG capsule; Take 1 capsule (40 mg total) by mouth daily.  Dispense: 90 capsule; Refill: 1  6. Recurrent major depressive disorder, in partial remission Psychiatry - FLUoxetine  (PROZAC ) 40  MG capsule; Take 1 capsule (40 mg total) by mouth daily.  Dispense: 90 capsule; Refill: 3  7. BMI 40.0-44.9, adult [facility]) Will address this with the patient at next visit  8. Dysuria - UA/M w/rflx Culture, Routine - CBC with Differential/Platelet - Lipid Panel With LDL/HDL Ratio - TSH - T4, free - Comprehensive metabolic panel with GFR - Microscopic Examination - Urine Culture, Reflex   General Counseling: Raven verbalizes understanding of the findings of todays visit and agrees with plan of treatment. I have discussed any further diagnostic evaluation that may be needed or ordered today. We also reviewed her medications today. she has been encouraged to call the office with any questions or concerns that should arise related to todays visit.    Counseling:   Controlled Substance Database was reviewed by me.  Orders Placed This Encounter  Procedures   Microscopic Examination   Urine Culture, Reflex   DG Bone Density   UA/M w/rflx Culture, Routine   CBC with Differential/Platelet   Lipid Panel With LDL/HDL Ratio   TSH   T4, free   Comprehensive metabolic panel with GFR    Meds ordered this encounter  Medications   FLUoxetine  (PROZAC ) 40 MG capsule     Sig: Take 1 capsule (40 mg total) by mouth daily.    Dispense:  90 capsule    Refill:  3   omeprazole  (PRILOSEC) 40 MG capsule    Sig: Take 1 capsule (40 mg total) by mouth daily.    Dispense:  90 capsule    Refill:  1    Total time spent:35 Minutes  Time spent includes review of chart, medications, test results, and follow up plan with the patient.     Sigrid CHRISTELLA Bathe, MD  Internal Medicine  "

## 2024-06-06 LAB — UA/M W/RFLX CULTURE, ROUTINE
Bilirubin, UA: NEGATIVE
Glucose, UA: NEGATIVE
Ketones, UA: NEGATIVE
Nitrite, UA: NEGATIVE
RBC, UA: NEGATIVE
Specific Gravity, UA: 1.026 (ref 1.005–1.030)
Urobilinogen, Ur: 1 mg/dL (ref 0.2–1.0)
pH, UA: 7 (ref 5.0–7.5)

## 2024-06-06 LAB — MICROSCOPIC EXAMINATION
Casts: NONE SEEN /LPF
Epithelial Cells (non renal): >10 /HPF — AB (ref 0–10)

## 2024-06-06 LAB — URINE CULTURE, REFLEX

## 2024-06-11 DIAGNOSIS — L309 Dermatitis, unspecified: Secondary | ICD-10-CM

## 2024-06-15 LAB — COMPREHENSIVE METABOLIC PANEL WITH GFR
ALT: 34 IU/L — ABNORMAL HIGH (ref 0–32)
AST: 24 IU/L (ref 0–40)
Albumin: 4.5 g/dL (ref 3.8–4.9)
Alkaline Phosphatase: 67 IU/L (ref 49–135)
BUN/Creatinine Ratio: 13 (ref 9–23)
BUN: 11 mg/dL (ref 6–24)
Bilirubin Total: 0.5 mg/dL (ref 0.0–1.2)
CO2: 21 mmol/L (ref 20–29)
Calcium: 9.5 mg/dL (ref 8.7–10.2)
Chloride: 104 mmol/L (ref 96–106)
Creatinine, Ser: 0.87 mg/dL (ref 0.57–1.00)
Globulin, Total: 2.7 g/dL (ref 1.5–4.5)
Glucose: 102 mg/dL — ABNORMAL HIGH (ref 70–99)
Potassium: 4.9 mmol/L (ref 3.5–5.2)
Sodium: 141 mmol/L (ref 134–144)
Total Protein: 7.2 g/dL (ref 6.0–8.5)
eGFR: 78 mL/min/1.73

## 2024-06-15 LAB — CBC WITH DIFFERENTIAL/PLATELET
Basophils Absolute: 0 x10E3/uL (ref 0.0–0.2)
Basos: 1 %
EOS (ABSOLUTE): 0.2 x10E3/uL (ref 0.0–0.4)
Eos: 3 %
Hematocrit: 42.6 % (ref 34.0–46.6)
Hemoglobin: 13.7 g/dL (ref 11.1–15.9)
Immature Grans (Abs): 0 x10E3/uL (ref 0.0–0.1)
Immature Granulocytes: 0 %
Lymphocytes Absolute: 2 x10E3/uL (ref 0.7–3.1)
Lymphs: 35 %
MCH: 30.5 pg (ref 26.6–33.0)
MCHC: 32.2 g/dL (ref 31.5–35.7)
MCV: 95 fL (ref 79–97)
Monocytes Absolute: 0.4 x10E3/uL (ref 0.1–0.9)
Monocytes: 7 %
Neutrophils Absolute: 3.2 x10E3/uL (ref 1.4–7.0)
Neutrophils: 54 %
Platelets: 329 x10E3/uL (ref 150–450)
RBC: 4.49 x10E6/uL (ref 3.77–5.28)
RDW: 13.3 % (ref 11.7–15.4)
WBC: 5.8 x10E3/uL (ref 3.4–10.8)

## 2024-06-15 LAB — LIPID PANEL WITH LDL/HDL RATIO
Cholesterol, Total: 260 mg/dL — ABNORMAL HIGH (ref 100–199)
HDL: 52 mg/dL
LDL Chol Calc (NIH): 185 mg/dL — ABNORMAL HIGH (ref 0–99)
LDL/HDL Ratio: 3.6 ratio — ABNORMAL HIGH (ref 0.0–3.2)
Triglycerides: 125 mg/dL (ref 0–149)
VLDL Cholesterol Cal: 23 mg/dL (ref 5–40)

## 2024-06-15 LAB — T4, FREE: Free T4: 1.37 ng/dL (ref 0.82–1.77)

## 2024-06-15 LAB — TSH: TSH: 1.71 u[IU]/mL (ref 0.450–4.500)

## 2024-06-17 ENCOUNTER — Ambulatory Visit: Payer: Self-pay | Admitting: Internal Medicine

## 2024-06-17 ENCOUNTER — Other Ambulatory Visit: Payer: Self-pay

## 2024-06-17 MED ORDER — ROSUVASTATIN CALCIUM 10 MG PO TABS: 10.0000 mg | ORAL_TABLET | Freq: Every day | ORAL | 2 refills | Status: AC

## 2024-06-17 NOTE — Telephone Encounter (Signed)
-----   Message from Sigrid Bathe, MD sent at 06/17/2024  3:19 PM EST ----- Abnormal lipid profile Start crestor  10 mg po every day #90 2 refills

## 2024-06-17 NOTE — Telephone Encounter (Signed)
 Spoke with patient regarding labs, Crestor  sent per [facility].

## 2024-06-20 ENCOUNTER — Ambulatory Visit
Admission: RE | Admit: 2024-06-20 | Discharge: 2024-06-20 | Disposition: A | Discharge status: Home / Self Care | Source: Ambulatory Visit | Attending: Internal Medicine | Admitting: Internal Medicine

## 2024-06-20 DIAGNOSIS — K219 Gastro-esophageal reflux disease without esophagitis: Secondary | ICD-10-CM | POA: Diagnosis present

## 2024-06-20 DIAGNOSIS — R300 Dysuria: Secondary | ICD-10-CM | POA: Insufficient documentation

## 2024-06-20 DIAGNOSIS — E2839 Other primary ovarian failure: Secondary | ICD-10-CM | POA: Diagnosis present

## 2024-06-20 DIAGNOSIS — Z0001 Encounter for general adult medical examination with abnormal findings: Secondary | ICD-10-CM | POA: Insufficient documentation

## 2024-06-20 DIAGNOSIS — Z8542 Personal history of malignant neoplasm of other parts of uterus: Secondary | ICD-10-CM | POA: Diagnosis present

## 2024-06-20 DIAGNOSIS — E782 Mixed hyperlipidemia: Secondary | ICD-10-CM | POA: Diagnosis present

## 2024-06-21 NOTE — Telephone Encounter (Signed)
 LVM for patient regarding normal bone density scan.

## 2024-06-21 NOTE — Telephone Encounter (Signed)
-----   Message from Sigrid Bathe, MD sent at 06/20/2024 11:14 PM EST ----- Bone density shows no bone loss. Its normal

## 2024-07-17 ENCOUNTER — Ambulatory Visit

## 2024-07-19 ENCOUNTER — Ambulatory Visit: Admitting: Nurse Practitioner

## 2024-12-03 ENCOUNTER — Ambulatory Visit: Admitting: Internal Medicine

## 2025-06-10 ENCOUNTER — Encounter: Admitting: Internal Medicine
